# Patient Record
Sex: Female | Born: 1965 | Marital: Married | Smoking: Never smoker
Health system: Northeastern US, Community
[De-identification: ages and names within clinical notes are randomized; demographics above are authoritative.]

## PROBLEM LIST (undated history)

## (undated) DIAGNOSIS — E789 Disorder of lipoprotein metabolism, unspecified: Secondary | ICD-10-CM

## (undated) DIAGNOSIS — Z833 Family history of diabetes mellitus: Secondary | ICD-10-CM

## (undated) HISTORY — PX: EXCISION EXCESSIVE SKIN & SUBQ TISSUE ABDOMEN: PRO029

## (undated) HISTORY — DX: Family history of diabetes mellitus: Z83.3

---

## 2005-05-02 ENCOUNTER — Emergency Department: Payer: Self-pay | Admitting: Emergency Medicine

## 2005-05-12 ENCOUNTER — Emergency Department: Payer: Self-pay | Admitting: Emergency Medical Services

## 2012-11-01 ENCOUNTER — Encounter (HOSPITAL_BASED_OUTPATIENT_CLINIC_OR_DEPARTMENT_OTHER): Payer: Self-pay | Admitting: Emergency Medicine

## 2012-11-01 ENCOUNTER — Ambulatory Visit (HOSPITAL_BASED_OUTPATIENT_CLINIC_OR_DEPARTMENT_OTHER): Payer: PRIVATE HEALTH INSURANCE | Admitting: Emergency Medicine

## 2012-11-01 VITALS — BP 130/88 | HR 74 | Temp 98.6°F | Ht 60.43 in | Wt 176.0 lb

## 2012-11-01 DIAGNOSIS — Z0000 Encounter for general adult medical examination without abnormal findings: Principal | ICD-10-CM

## 2012-11-01 DIAGNOSIS — D509 Iron deficiency anemia, unspecified: Secondary | ICD-10-CM

## 2012-11-01 DIAGNOSIS — Z833 Family history of diabetes mellitus: Secondary | ICD-10-CM

## 2012-11-01 DIAGNOSIS — Z7189 Other specified counseling: Secondary | ICD-10-CM

## 2012-11-01 DIAGNOSIS — Z1239 Encounter for other screening for malignant neoplasm of breast: Secondary | ICD-10-CM

## 2012-11-01 DIAGNOSIS — R300 Dysuria: Secondary | ICD-10-CM

## 2012-11-01 HISTORY — DX: Family history of diabetes mellitus: Z83.3

## 2012-11-01 LAB — URINALYSIS
BILIRUBIN, URINE: NEGATIVE
GLUCOSE, URINE: NEGATIVE MG/DL
KETONE, URINE: NEGATIVE MG/DL
LEUKOCYTE ESTERASE: NEGATIVE
NITRITE, URINE: NEGATIVE
OCCULT BLOOD, URINE: NEGATIVE
PH URINE: 7.5 (ref 5.0–8.0)
PROTEIN, URINE: NEGATIVE MG/DL
SPECIFIC GRAVITY URINE: 1.01 (ref 1.003–1.035)

## 2012-11-01 LAB — URINE DIP (POINT OF CARE)
BILIRUBIN, URINE: NEGATIVE
GLUCOSE, URINE: NEGATIVE mg/dl
KETONE, URINE: NEGATIVE mg/dl
NITRITE, URINE: NEGATIVE
PH URINE: 7.5 (ref 5.0–8.0)
PROTEIN, URINE: NEGATIVE mg/dl (ref 0–15)
SPECIFIC GRAVITY URINE: 1.015 (ref 1.003–1.030)
UROBILINOGEN URINE: 0.2 mg/dl (ref 0.2–1.0)

## 2012-11-01 LAB — CBC, PLATELET & DIFFERENTIAL
ABSOLUTE BASO COUNT: 0 10*3/uL (ref 0.0–0.1)
ABSOLUTE EOSINOPHIL COUNT: 0.3 10*3/uL (ref 0.0–0.8)
ABSOLUTE IMM GRAN COUNT: 0.01 10*3/uL (ref 0.00–0.03)
ABSOLUTE LYMPH COUNT: 1.7 10*3/uL (ref 0.6–5.9)
ABSOLUTE MONO COUNT: 0.3 10*3/uL (ref 0.2–1.4)
ABSOLUTE NEUTROPHIL COUNT: 4.5 10*3/uL (ref 1.6–8.3)
BASOPHIL %: 0.6 % (ref 0.0–1.2)
EOSINOPHIL %: 4.3 % (ref 0.0–7.0)
HEMATOCRIT: 44.1 % (ref 34.1–44.9)
HEMOGLOBIN: 14.8 g/dL (ref 11.2–15.7)
IMMATURE GRANULOCYTE %: 0.1 % (ref 0.0–0.4)
LYMPHOCYTE %: 24.9 % (ref 15.0–54.0)
MEAN CORP HGB CONC: 33.6 g/dL (ref 31.0–37.0)
MEAN CORPUSCULAR HGB: 29.8 pg (ref 26.0–34.0)
MEAN CORPUSCULAR VOL: 88.9 fL (ref 80.0–100.0)
MEAN PLATELET VOLUME: 10.9 fL (ref 8.7–12.5)
MONOCYTE %: 3.9 % — ABNORMAL LOW (ref 4.0–13.0)
NEUTROPHIL %: 66.2 % (ref 40.0–75.0)
PLATELET COUNT: 282 10*3/uL (ref 150–400)
RBC DISTRIBUTION WIDTH STD DEV: 41.4 fL (ref 35.1–46.3)
RBC DISTRIBUTION WIDTH: 12.8 % (ref 11.5–14.3)
RED BLOOD CELL COUNT: 4.96 M/uL (ref 3.90–5.20)
WHITE BLOOD CELL COUNT: 6.7 10*3/uL (ref 4.0–11.0)

## 2012-11-01 NOTE — Patient Instructions (Addendum)
Please ask for visit records for PAP smear from [place] - ask if HPV test was done.  Please get copy of ultrasound results - computer disk - to bring.  Please bring any vaccination records (tetanus or others).    Please call [phone] to schedule your mammogram at [facility] (or other [place] location).
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
## 2012-11-02 DIAGNOSIS — D509 Iron deficiency anemia, unspecified: Secondary | ICD-10-CM | POA: Insufficient documentation

## 2012-11-02 LAB — URINE CULTURE: URINE CULTURE/COLONY COUNT: NO GROWTH

## 2012-11-02 LAB — HEMOGLOBIN A1C
ESTIMATED AVERAGE GLUCOSE: 111 (ref 74–160)
HEMOGLOBIN A1C: 5.5 % (ref 4.0–5.6)

## 2012-11-04 LAB — COMPREHENSIVE METABOLIC PANEL
ALANINE AMINOTRANSFERASE: 21 IU/L (ref 7–35)
ALBUMIN: 4.6 g/dl (ref 3.4–4.8)
ALKALINE PHOSPHATASE: 77 IU/L (ref 25–106)
ANION GAP: 11 mmol/L (ref 3–11)
ASPARTATE AMINOTRANSFERASE: 23 IU/L (ref 8–34)
BILIRUBIN TOTAL: 0.8 mg/dl (ref 0.2–1.1)
BUN (UREA NITROGEN): 15 mg/dl (ref 6–20)
CALCIUM: 9.6 mg/dl (ref 8.6–10.3)
CARBON DIOXIDE: 25 mmol/L (ref 22–32)
CHLORIDE: 102 mmol/L (ref 101–111)
CREATININE: 0.7 mg/dl (ref 0.4–1.2)
ESTIMATED GLOMERULAR FILT RATE: >60 mL/min (ref >60)
Glucose Random: 81 mg/dl (ref 74–160)
POTASSIUM: 4.2 mmol/L (ref 3.5–5.1)
SODIUM: 138 mmol/L (ref 135–144)
TOTAL PROTEIN: 7.6 g/dl — ABNORMAL HIGH (ref 5.9–7.5)

## 2012-11-04 LAB — VITAMIN D,25 HYDROXY: VITAMIN D,25 HYDROXY: 37 ng/ml (ref 30.0–100.0)

## 2012-11-04 LAB — HIV ANTIGEN ANTIBODY: HIV 1/2 PLUS O AG/AB SCREEN: NONREACTIVE

## 2012-11-04 LAB — FERRITIN: FERRITIN: 25 ng/ml (ref 11–307)

## 2012-11-04 LAB — HEPATITIS B SURFACE ANTIGEN: HEPATITIS B SURFACE ANTIGEN: NONREACTIVE

## 2012-11-04 LAB — HEPATITIS B SURFACE ANTIBODY: HEPATITIS B SURFACE ANTIBODY: NONREACTIVE

## 2012-11-04 LAB — LOW DENSITY LIPOPROTEIN DIRECT: LOW DENSITY LIPOPROTEIN DIRECT: 192 mg/dl (ref 0–100)

## 2012-11-04 LAB — HIGH DENSITY LIPOPROTEIN: HIGH DENSITY LIPOPROTEIN: 54 mg/dl (ref 35–85)

## 2012-11-04 LAB — CHOLESTEROL: Cholesterol: 278 mg/dl — ABNORMAL HIGH (ref 0–200)

## 2012-11-04 LAB — HEPATITIS C ANTIBODY: HEPATITIS C ANTIBODY: NEGATIVE

## 2012-11-08 ENCOUNTER — Telehealth (HOSPITAL_BASED_OUTPATIENT_CLINIC_OR_DEPARTMENT_OTHER): Payer: Self-pay | Admitting: Physician Assistant

## 2012-11-08 DIAGNOSIS — E785 Hyperlipidemia, unspecified: Principal | ICD-10-CM

## 2012-11-08 MED ORDER — ATORVASTATIN CALCIUM 40 MG PO TABS
40.0000 mg | ORAL_TABLET | Freq: Every day | ORAL | Status: DC
Start: 2012-11-08 — End: 2012-12-21

## 2012-11-08 NOTE — Progress Notes (Signed)
Called pt to discuss test results. Informed her that LDL was very high at 192, and that by new guidelines she should be treated with medication to lower her cholesterol. Discussed possible side effects of statin medications, she agreed to try treatment. Will send atorvastatin given new guidelines recommending intense statin with LDL > 190.

## 2012-11-10 ENCOUNTER — Telehealth (HOSPITAL_BASED_OUTPATIENT_CLINIC_OR_DEPARTMENT_OTHER): Payer: Self-pay | Admitting: Emergency Medicine

## 2012-11-10 NOTE — Progress Notes (Addendum)
.  Tammy Bradford, 11/10/2012, 8:21 AM  Prior authorization required for medication: (LIPITOR) 40 MG tablet    Membership ID (insurance): [id number]  if Medicare insurance , Prescription drug plan: Unknown

## 2012-11-15 ENCOUNTER — Telehealth (HOSPITAL_BASED_OUTPATIENT_CLINIC_OR_DEPARTMENT_OTHER): Payer: Self-pay | Admitting: Physician Assistant

## 2012-11-15 NOTE — Progress Notes (Signed)
Prior authorization request for atorvastastin  Clinical Indication: LDL >190, per new ACC/AHA guidelines this level of LDL should be treated with high intensity statin such as atorvastatin   Previous medications tried: none, see above  Diagnostic testing information: LDL 192

## 2012-11-15 NOTE — Progress Notes (Signed)
PRIOR AUTHORIZATION FOR Atorvastatin 40 mg  has been approved for 3 Years    Start Date: 11/15/2012  End Date: 11/16/2015    PA #: [id number]     The patient and their preferred pharmacy are aware of the approval.  The approval letter will be scanned into [employer] once received from insurance company.     Patient has [employer] for prescription coverage.  I.D.# [id number]

## 2012-11-23 ENCOUNTER — Ambulatory Visit: Payer: Self-pay | Admitting: Emergency Medicine

## 2012-11-25 LAB — MA SCREENING MAMMO BILATERAL WITH CAD

## 2012-12-14 ENCOUNTER — Ambulatory Visit (HOSPITAL_BASED_OUTPATIENT_CLINIC_OR_DEPARTMENT_OTHER): Payer: PRIVATE HEALTH INSURANCE

## 2012-12-14 DIAGNOSIS — E785 Hyperlipidemia, unspecified: Principal | ICD-10-CM

## 2012-12-14 LAB — HIGH DENSITY LIPOPROTEIN: HIGH DENSITY LIPOPROTEIN: 54 mg/dL (ref 40–60)

## 2012-12-14 LAB — LOW DENSITY LIPOPROTEIN DIRECT: LOW DENSITY LIPOPROTEIN DIRECT: 88 mg/dL (ref 0–189)

## 2012-12-14 LAB — CHOLESTEROL: Cholesterol: 154 mg/dL (ref 0–239)

## 2012-12-14 NOTE — Progress Notes (Signed)
Labs drawn.  1sst  sr.

## 2012-12-16 ENCOUNTER — Ambulatory Visit: Payer: Self-pay | Admitting: Emergency Medicine

## 2012-12-19 NOTE — Progress Notes (Signed)
Quick Note:    Please send a letter from me with test results and the following message:    Your cholesterol numbers are excellent! Please continue taking your medications and call the clinic if you have any questions.    ______

## 2012-12-21 ENCOUNTER — Ambulatory Visit (HOSPITAL_BASED_OUTPATIENT_CLINIC_OR_DEPARTMENT_OTHER): Payer: PRIVATE HEALTH INSURANCE | Admitting: Emergency Medicine

## 2012-12-21 ENCOUNTER — Encounter (HOSPITAL_BASED_OUTPATIENT_CLINIC_OR_DEPARTMENT_OTHER): Payer: Self-pay | Admitting: Emergency Medicine

## 2012-12-21 VITALS — BP 139/91 | HR 69 | Temp 99.1°F | Wt 175.0 lb

## 2012-12-21 DIAGNOSIS — E785 Hyperlipidemia, unspecified: Secondary | ICD-10-CM

## 2012-12-21 DIAGNOSIS — R1013 Epigastric pain: Principal | ICD-10-CM

## 2012-12-21 DIAGNOSIS — Z23 Encounter for immunization: Secondary | ICD-10-CM

## 2012-12-21 DIAGNOSIS — A048 Other specified bacterial intestinal infections: Secondary | ICD-10-CM | POA: Insufficient documentation

## 2012-12-21 LAB — US BREAST BILATERAL

## 2012-12-21 MED ORDER — ATORVASTATIN CALCIUM 40 MG PO TABS
40.0000 mg | ORAL_TABLET | Freq: Every day | ORAL | Status: DC
Start: 2012-12-21 — End: 2013-10-14

## 2012-12-21 NOTE — Progress Notes (Signed)
Quick Note:    Reviewed at office visit 12/21/2012  Given copy of results.  ______

## 2012-12-21 NOTE — Patient Instructions (Addendum)
Call the pharmacy for refills of cholesterol medicine.    Fasting blood tests - return in morning - nothing to eat or drink but water for 10-12 hours.  lab is open at 9am Mon-Friday when clinic is open.  No appointment is necessary.  In 6 months - October 2014.

## 2012-12-21 NOTE — Progress Notes (Signed)
Tammy Bradford is a 47 year old female  Cholesterol levels.    Started atorvastatin - March 2014 - taking as directed.  Has cut beef down to once a week. Limiting shrimp and lobster - increasing fruits and vegetables. 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  Given results - asks if she can stop medication - discussed that to keep the LDL low - she needs to continue medication and diet/exercise.  She wants to know if dose could be cut down.  Encouraged to maintain lifestyle changes - and will retest in 6 months  - at that time  - we can try cutting back on the dose.  Future order done for fasting lipids.    LDL DIRECT (mg/dL)   Date  Value    09/12/1094  88     11/01/2012  192*   ----------    Had episode of epigastric pain - which she thought might be liver or return of h pylori.  Started omeprazole with good effect.  Treated one year ago for h pylori - discussed that blood test may still be positive - so would need to test stool - order done.            Physical Exam   Nursing note and vitals reviewed.  Constitutional: She appears well-developed and well-nourished. She appears distressed.   Neurological: She is alert.   Skin: Skin is warm.   Psychiatric: She has a normal mood and affect. Her behavior is normal.     ASSESSMENT/PLAN:  (789.06) Abdominal pain, epigastric  (primary encounter diagnosis)  Comment: will retest for h pylori - encouraged to cut back on omeprazole - as may be difficult to taper the longer she is on it.  Plan: HELICOBACTER PYLORI STOOL AG (INHOUSE)          (272.4) Hyperlipidemia  Comment: refill done.  Plan: atorvastatin (LIPITOR) 40 MG tablet, LIPID         PANEL, COLLECTION VENOUS BLOOD VENIPUNCTURE          (V05.3) Need for prophylactic vaccination and inoculation against viral hepatitis  Plan: HEPATITIS B VACCINE ADULT 3 DOSE IM, HEPATITIS         B VACCINE ADULT 3 DOSE IM, IMMUNIZATION ADMIN         SINGLE, RN          (V06.1) Need for prophylactic vaccination with combined  diphtheria-tetanus-pertussis (DTP) vaccine  Plan: TDAP VACCINE 7 YRS/> IM, IMMUNIZATION ADMIN         EACH ADD, RN                  12/21/2012  VIS given prior to administration and reviewed with the patient and or legal guardian. Patient understands the disease and the vaccine. See immunization/Injection module or chart review for date of publication and additional information.  Helayne Seminole, MD  12/21/2012  VIS given prior to administration and reviewed with the patient and or legal guardian. Patient understands the disease and the vaccine. See immunization/Injection module or chart review for date of publication and additional information.  Helayne Seminole, MD

## 2012-12-21 NOTE — Progress Notes (Signed)
12/21/2012  VIS given prior to administration and reviewed with the patient and or legal guardian. Patient understands the disease and the vaccine. See immunization/Injection module or chart review for date of publication and additional information.  Arcelia Jew, LPN    Per orders of Dr. Loralee Pacas, injection of Hep-B, Tdap given by Arcelia Jew, LPN. Patient instructed to remain in clinic for 20 minutes afterwards, and to report any adverse reaction to me immediately.

## 2012-12-25 LAB — HELICOBACTER PYLORI STOOL AG

## 2012-12-31 NOTE — Progress Notes (Signed)
Quick Note:    Dear Tammy Bradford    Your recent stool test did not show h pylori infection.    Please call the clinic if you have any questions.    Sincerely,      Helayne Seminole, MD    ______

## 2013-01-11 ENCOUNTER — Telehealth (HOSPITAL_BASED_OUTPATIENT_CLINIC_OR_DEPARTMENT_OTHER): Payer: Self-pay | Admitting: Physician Assistant

## 2013-01-11 ENCOUNTER — Ambulatory Visit: Payer: Self-pay | Admitting: Emergency Medicine

## 2013-01-11 LAB — MA DIAGNOSTIC MAMMO BILATERAL WITH CAD

## 2013-01-11 LAB — US BREAST-AXILLA LEFT

## 2013-01-11 NOTE — Progress Notes (Signed)
Called pt to discuss recent breast ultrasound results. No answer so left general message requesting return call if desired, will send letter with results. Pt had BI-RADS 0 mammogram, and additional ultrasound imaging showed benign finding of cysts, recommend one year repeat mammogram

## 2013-01-11 NOTE — Progress Notes (Signed)
Quick Note:    Please send a letter from me with the following message:    Additional ultrasound imaging has shown cysts in both of your breasts, which are a benign finding. You should repeat a mammogram in 1 year. Please call the clinic if you have any questions.    ______

## 2013-01-13 ENCOUNTER — Telehealth (HOSPITAL_BASED_OUTPATIENT_CLINIC_OR_DEPARTMENT_OTHER): Payer: Self-pay | Admitting: Internal Medicine

## 2013-01-13 NOTE — Progress Notes (Addendum)
Paged by patient for abd pain    Started with morning with HA and abdominal pain and heartburn  No vomiting  Feet and hands are getting cold  No fevers  No diarrhea  Started after having coffee this AM  Taking mint tea  Had normal BM yesterday  Has not tried tums  Takes omeprazole    Had pain like this a few weeks ago - but not as severe and saw dr. Loralee Pacas and told dr. Loralee Pacas she was worried about h.pylori infxn which she has had in the past  Dr. Loralee Pacas did a stool study that was negative for h.pylori    Throughout the day today, pain is not getting worse or better today but staying the same     Meds: taking atorvastatin 40 mg and aspirin 81 mg and omperazole 20 mg    Discussed with her this could be gastritis, esophagitis, ulcer, pancreatitis or obstruction    If she cant' drink fluids -->to ED  If pain worsens or does not improve -->to ED  Otherwise, can try TUMS and rest - and I can see her at 2:20 tomorrow for more eval    Can call me if she has any questions- will be on page.

## 2013-01-14 ENCOUNTER — Encounter (HOSPITAL_BASED_OUTPATIENT_CLINIC_OR_DEPARTMENT_OTHER): Payer: Self-pay | Admitting: Internal Medicine

## 2013-01-14 ENCOUNTER — Ambulatory Visit (HOSPITAL_BASED_OUTPATIENT_CLINIC_OR_DEPARTMENT_OTHER): Payer: PRIVATE HEALTH INSURANCE | Admitting: Internal Medicine

## 2013-01-14 VITALS — BP 134/76 | HR 84 | Temp 98.7°F | Wt 172.9 lb

## 2013-01-14 DIAGNOSIS — R51 Headache: Secondary | ICD-10-CM

## 2013-01-14 DIAGNOSIS — R519 Headache, unspecified: Secondary | ICD-10-CM

## 2013-01-14 DIAGNOSIS — K2970 Gastritis, unspecified, without bleeding: Principal | ICD-10-CM

## 2013-01-14 DIAGNOSIS — R350 Frequency of micturition: Secondary | ICD-10-CM

## 2013-01-14 LAB — URINE DIP (POINT OF CARE)
BILIRUBIN, URINE: NEGATIVE
GLUCOSE, URINE: NEGATIVE mg/dl
KETONE, URINE: NEGATIVE mg/dl
NITRITE, URINE: NEGATIVE
OCCULT BLOOD, URINE: NEGATIVE
PH URINE: 6.5 (ref 5.0–8.0)
PROTEIN, URINE: NEGATIVE mg/dl (ref 0–15)
SPECIFIC GRAVITY URINE: 1.02 (ref 1.003–1.030)
UROBILINOGEN URINE: 0.2 mg/dl (ref 0.2–1.0)

## 2013-01-14 LAB — CBC WITH PLATELET
HEMATOCRIT: 42.2 % (ref 34.1–44.9)
HEMOGLOBIN: 14.4 g/dL (ref 11.2–15.7)
MEAN CORP HGB CONC: 34.1 g/dL (ref 31.0–37.0)
MEAN CORPUSCULAR HGB: 30.1 pg (ref 26.0–34.0)
MEAN CORPUSCULAR VOL: 88.1 fL (ref 80.0–100.0)
MEAN PLATELET VOLUME: 10.8 fL (ref 8.7–12.5)
PLATELET COUNT: 254 10*3/uL (ref 150–400)
RBC DISTRIBUTION WIDTH STD DEV: 39.5 fL (ref 35.1–46.3)
RBC DISTRIBUTION WIDTH: 12.3 % (ref 11.5–14.3)
RED BLOOD CELL COUNT: 4.79 M/uL (ref 3.90–5.20)
WHITE BLOOD CELL COUNT: 7.3 10*3/uL (ref 4.0–11.0)

## 2013-01-14 LAB — FERRITIN: FERRITIN: 30 ng/mL (ref 8–252)

## 2013-01-14 MED ORDER — OMEPRAZOLE 20 MG PO CPDR
20.0000 mg | DELAYED_RELEASE_CAPSULE | Freq: Every day | ORAL | Status: DC
Start: 2013-01-14 — End: 2013-03-18

## 2013-01-14 MED ORDER — RANITIDINE HCL 150 MG PO CAPS
150.00 mg | ORAL_CAPSULE | Freq: Two times a day (BID) | ORAL | Status: AC
Start: 2013-01-14 — End: 2013-01-21

## 2013-01-14 NOTE — Progress Notes (Signed)
Tammy Bradford is a 47 year old female here for abd pain   I spoke to her on the phone yesterday  After having coffee yesterday AM developed severe abd pain epigastric with reflux symptoms  Severe all day and her abd hurt and her extremities were cold  When I spoke to her i recommended she take TUMS which she says helped a lot  She is on omeprazole 20 mg but just started  She was recently retested for h.pylori by stool and this was negative.    She DOES has hx of gastritis for which she was found to have h.pylori in the PAST and treated  She takes asa 81 mg a day as she was once in the ED at [facility] with chest pain - she states she was told she did not have a heart problem but that it "could not hurt" to take an aspirin daily  Does not take nsaids  No melena  No diarrhea, no constipation  No vomiting  No hx of EGD  Non smoker  No family hx of GI malignancy    Other concerns  Woke up with some pain at the back of her head today  No neck stiffness  No fever    Notes that for the past few weeks she has been urinating more frequently and it hurts a little      BP 134/76  Pulse 84  Temp(Src) 98.7 F (37.1 C) (Temporal)  Wt 172 lb 14.4 oz (78.427 kg)  BMI 33.29 kg/m2  SpO2 98%  LMP 11/01/2012  Well appearing  Abd: normoactive BS  Soft,   Mild tenderness in epigastrium without rebound  No suprapubic pain  MSK: tender along occiput to palpation    Urine dip = +leuk, no blood, no nitrates    A/P    Gastritis - likely not ulcer given resolved with TUMS  Will check CBC and ferritin today to make sure there is no blood loss  Will given raniditine 150 mg bid for one week as it works more quickly than omeprazole and have her take omeprazole 20 mg daily at the same time  Stop aspirin    She has never had an EGD and might need one -will confer with dr. Loralee Pacas    Urinary frequency - with +leuk on dip -send for dx    Occipital HA- may be positional re: how she sleeps. Seems more MSK on exam  Will follow- if worsens,  asked her to let us know    We discussed the patients current medications. The patient expressed understanding and no barriers to adherence were identified.Medications were reconciled during this visit and a current medication list was given to the patient at the end of the visit. I have reviewed the past medical, surgical, social and family history and updated these sections of EpicCare as relevant. All interim labs, test results, and consult notes were reviewed and discussed with Tammy Bradford.

## 2013-01-14 NOTE — Patient Instructions (Signed)
STOP ASPIRIN  TAKE ZANTAC (RANITIDINE) TWICE A DAY FOR ONE WEEK  IT IS OK ALSO TO USE TUMS  AT THE SAME TIME, START OMEPRAZOLE 20 MG DAILY

## 2013-01-15 LAB — URINALYSIS
BACTERIA: >50 PER HPF — AB (ref 0–5)
BILIRUBIN, URINE: NEGATIVE
CASTS: NONE SEEN PER LPF
CRYSTALS: NONE SEEN
GLUCOSE, URINE: NEGATIVE MG/DL
KETONE, URINE: NEGATIVE MG/DL
NITRITE, URINE: NEGATIVE
OCCULT BLOOD, URINE: NEGATIVE
PH URINE: 6 (ref 5.0–8.0)
PROTEIN, URINE: NEGATIVE MG/DL
SPECIFIC GRAVITY URINE: 1.02 (ref 1.003–1.035)
SQUAMOUS EPITHELIAL CELLS: >10 PER LPF — AB (ref 0–4)

## 2013-01-15 LAB — URINE CULTURE: URINE CULTURE/COLONY COUNT: NO GROWTH

## 2013-01-16 NOTE — Progress Notes (Signed)
Quick Note:    Tammy Bradford  This patient saw me Saturday for bad gastritis  Labs are reassuring in that there is no evidence of GI bleed  She was also having some urinary complaints but the UA is normal   She has had recurrent gastritis - do you want me to set up an endoscopy or recommend a GI appt- or are you interested in speaking to her first  ______

## 2013-01-18 ENCOUNTER — Telehealth (HOSPITAL_BASED_OUTPATIENT_CLINIC_OR_DEPARTMENT_OTHER): Payer: Self-pay | Admitting: Emergency Medicine

## 2013-01-18 DIAGNOSIS — R1013 Epigastric pain: Principal | ICD-10-CM

## 2013-01-18 NOTE — Progress Notes (Signed)
Patient restarted on PPI for epigastric pain.  H pylori stool test negative.  I would like to refer to GI for EGD.  Will ask Larita Fife to call her to discuss -   If she is agreeable - I will place referral to GI.

## 2013-01-19 NOTE — Progress Notes (Signed)
Tried to call patient, no answer, left message on voicemail to call clinic back.

## 2013-01-20 NOTE — Progress Notes (Signed)
Called patient, explained message below, she agrees to see GI, advised her provider will enter referral and she will be contacted when appt is scheduled.

## 2013-01-25 ENCOUNTER — Ambulatory Visit (HOSPITAL_BASED_OUTPATIENT_CLINIC_OR_DEPARTMENT_OTHER): Payer: PRIVATE HEALTH INSURANCE

## 2013-01-25 DIAGNOSIS — Z23 Encounter for immunization: Principal | ICD-10-CM

## 2013-01-25 NOTE — Progress Notes (Signed)
Quick Note:    Dear Tammy Bradford    Thank you for getting your mammogram done!    Your mammogram is normal, and will need to be repeated in one year.    Sincerely,      Helayne Seminole, MD    ______

## 2013-01-25 NOTE — Progress Notes (Signed)
VIS given prior to administration and reviewed with the patient and or legal guardian. Patient understands the disease and the vaccine. See immunization/Injection module or chart review for date of publication and additional information.    #2 hep B vaccine given, patient understands third in series will be due 06/22/13.

## 2013-03-18 ENCOUNTER — Other Ambulatory Visit (HOSPITAL_BASED_OUTPATIENT_CLINIC_OR_DEPARTMENT_OTHER): Payer: Self-pay | Admitting: Emergency Medicine

## 2013-03-18 DIAGNOSIS — K2970 Gastritis, unspecified, without bleeding: Principal | ICD-10-CM

## 2013-03-18 MED ORDER — OMEPRAZOLE 20 MG PO CPDR
20.0000 mg | DELAYED_RELEASE_CAPSULE | Freq: Every day | ORAL | Status: AC
Start: 2013-03-18 — End: 2013-05-18

## 2013-03-18 NOTE — Progress Notes (Signed)
Person calling on behalf of patient: Pharmacy    Tammy Bradford is a 47 year old female       - medication(s) request: omeprazole 20 mg  - last office visit: 01/14/2013  - last physical exam: 11/01/2012      Other Med Adult:  Most Recent BP Reading(s)  01/14/13 : 134/76      Cholesterol (mg/dL)   Date  Value    09/12/1094  154    ----------  LDL DIRECT (mg/dL)   Date  Value    0/12/5407  88    ----------  HDL (mg/dL)   Date  Value    04/07/1913  54    ----------  No results found for this basename: tg      No results found for this basename: TSHSC      No results found for this basename: TSH    HEMOGLOBIN A1C (%)   Date  Value    11/01/2012  5.5    ----------      No results found for this basename: INR           Documented patient preferred pharmacies:    [id number] - [place], [place] - [address], [address] AT [facility]  Phone: [phone] Fax: [phone]

## 2013-03-18 NOTE — Telephone Encounter (Signed)
Message copied by Namon Cirri on Sat Mar 18, 2013  2:09 PM  ------       Message from: Kennyth Lose       Created: Sat Mar 18, 2013 12:25 PM       Regarding: refill         [facility]              Person calling on behalf of patient: Pharmacy              May list multiple medications in this section              Medicine Name: Omeprazole 20 mg 90 day supply                            Documented patient preferred pharmacies:        [id number] - [place], [place] - [address] AT [facility]       Phone: [phone] Fax: [phone]                       ------

## 2013-03-23 ENCOUNTER — Telehealth (HOSPITAL_BASED_OUTPATIENT_CLINIC_OR_DEPARTMENT_OTHER): Payer: Self-pay

## 2013-03-23 DIAGNOSIS — R1013 Epigastric pain: Principal | ICD-10-CM

## 2013-03-23 NOTE — Telephone Encounter (Signed)
Message copied by Emelia Loron on Thu Mar 23, 2013  9:52 AM  ------       Message from: Janalee Dane       Created: Thu Mar 23, 2013  9:25 AM       Regarding: requesting referral for GI                       Tammy Bradford [id number], 47 year old, female, Telephone Information:       Home Phone      [phone]       Work Phone      Not on file.       Mobile          Not on file.                     Patient's Preferred Pharmacy:               [id number] - [place], [place] - [address], [address] AT [facility]       Phone: [phone] Fax: [phone]                     CONFIRMED TODAY: Yes              CALL BACK NUMBER: [phone]              Patient's language of care: English              Patient does not need an interpreter.              Patient's PCP: Helayne Seminole, MD              Person calling on behalf of patient: Patient (self)              Calls today requesting a referral. What is the reason you need to see this specialist (diagnosis)? Patient was seen by Dr Renard Hamper in May for gastritis who left a note for Dr Loralee Pacas in her chart. There was a call put out but no referral was ever put in. She is requesting a referral because she is still has symptoms and wants to see the specialist. She can be reached at the above number if need be.b Thank you                ------

## 2013-03-23 NOTE — Progress Notes (Signed)
Patient awaiting GI referral, forwarded to pcp.

## 2013-03-23 NOTE — Progress Notes (Signed)
Referral to GI done

## 2013-03-28 ENCOUNTER — Ambulatory Visit (HOSPITAL_BASED_OUTPATIENT_CLINIC_OR_DEPARTMENT_OTHER): Payer: PRIVATE HEALTH INSURANCE | Admitting: Emergency Medicine

## 2013-03-28 ENCOUNTER — Encounter (HOSPITAL_BASED_OUTPATIENT_CLINIC_OR_DEPARTMENT_OTHER): Payer: Self-pay | Admitting: Emergency Medicine

## 2013-03-28 VITALS — BP 128/90 | HR 68 | Temp 97.6°F | Wt 172.7 lb

## 2013-03-28 DIAGNOSIS — E785 Hyperlipidemia, unspecified: Secondary | ICD-10-CM

## 2013-03-28 DIAGNOSIS — IMO0001 Reserved for inherently not codable concepts without codable children: Secondary | ICD-10-CM

## 2013-03-28 DIAGNOSIS — R350 Frequency of micturition: Secondary | ICD-10-CM

## 2013-03-28 DIAGNOSIS — R1013 Epigastric pain: Principal | ICD-10-CM

## 2013-03-28 LAB — COMPREHENSIVE METABOLIC PANEL
ALANINE AMINOTRANSFERASE: 38 U/L (ref 12–45)
ALBUMIN: 4.3 g/dL (ref 3.4–5.0)
ALKALINE PHOSPHATASE: 106 U/L (ref 45–117)
ANION GAP: 7 mmol/L (ref 5–15)
ASPARTATE AMINOTRANSFERASE: 22 U/L (ref 8–34)
BILIRUBIN TOTAL: 0.4 mg/dL (ref 0.2–1.0)
BUN (UREA NITROGEN): 12 mg/dL (ref 7–18)
CALCIUM: 9.9 mg/dL (ref 8.5–10.1)
CARBON DIOXIDE: 30 mmol/L (ref 21–32)
CHLORIDE: 103 mmol/L (ref 98–107)
CREATININE: 0.6 mg/dL (ref 0.4–1.2)
ESTIMATED GLOMERULAR FILT RATE: >60 mL/min (ref >60)
Glucose Random: 90 mg/dL (ref 74–160)
POTASSIUM: 4.2 mmol/L (ref 3.5–5.1)
SODIUM: 140 mmol/L (ref 136–145)
TOTAL PROTEIN: 7.5 g/dL (ref 6.4–8.2)

## 2013-03-28 LAB — URINE DIP (POINT OF CARE)
BILIRUBIN, URINE: NEGATIVE
GLUCOSE, URINE: NEGATIVE mg/dl
KETONE, URINE: NEGATIVE mg/dl
LEUKOCYTE ESTERASE: NEGATIVE
NITRITE, URINE: NEGATIVE
OCCULT BLOOD, URINE: NEGATIVE
PH URINE: 7 (ref 5.0–8.0)
PROTEIN, URINE: NEGATIVE mg/dl (ref 0–15)
SPECIFIC GRAVITY URINE: 1.01 (ref 1.003–1.030)
UROBILINOGEN URINE: 0.2 mg/dl (ref 0.2–1.0)

## 2013-03-28 LAB — CREATINE KINASE TOTAL: CREATINE KINASE TOTAL: 55 IU/L (ref 26–192)

## 2013-03-28 NOTE — Progress Notes (Signed)
Tammy Bradford is a 47 year old female  Epigastric pain -     Seen May 2014 - epigastric pain -started on omeprazole - working - helpful.  Missed dose on Wednesday morning - had severe pain - ongoing -   Has to eat every 2 hours - to reduce the pain.  Would like to have evaluation to see what is wrong - explained open access process.  h pylori stool test negative in April 2014.    Myalgias - in arms - she thinks it is cholesterol medicine - atorvastatin - started March  2014 - for very high LDL.  Started one month ago - Upper back and shoulders.  She was afraid to stop without speaking to me - so cut pill in half.    LDL DIRECT (mg/dL)   Date  Value    01/09/980  88     On atorvastatin    11/01/2012  192*   ----------      Urinary frequency - no burning.  No fever chills - nausea or vomiting.        Physical Exam   Nursing note and vitals reviewed.  Constitutional: She appears well-developed and well-nourished.   Abdominal: Soft. There is tenderness (suprapubic tenderness).   No CVAT   Neurological: She is alert.   Skin: Skin is warm and dry.   Psychiatric: She has a normal mood and affect. Her behavior is normal.     U/a dip is negative.    ASSESSMENT/PLAN:  (789.06) Abdominal pain, epigastric  (primary encounter diagnosis)  Comment: continue omeprazole.  Plan: REFERRAL TO OPEN ACCESS UPPER ENDOSCOPY (EGD)          (729.1) Myalgia and myositis, unspecified  (272.4) Hyperlipidemia  Comment: advised to stop atorvastatin - will check labs -   Plan: CREATINE KINASE (CK), COMPREHENSIVE METABOLIC         PANEL, COLLECTION VENOUS BLOOD VENIPUNCTURE          (788.41) Urinary frequency  Comment: neg urine dip

## 2013-03-31 ENCOUNTER — Telehealth (HOSPITAL_BASED_OUTPATIENT_CLINIC_OR_DEPARTMENT_OTHER): Payer: Self-pay

## 2013-04-26 ENCOUNTER — Ambulatory Visit (HOSPITAL_BASED_OUTPATIENT_CLINIC_OR_DEPARTMENT_OTHER): Payer: PRIVATE HEALTH INSURANCE | Admitting: Gastroenterology

## 2013-05-09 ENCOUNTER — Encounter (HOSPITAL_BASED_OUTPATIENT_CLINIC_OR_DEPARTMENT_OTHER): Payer: Self-pay

## 2013-05-09 ENCOUNTER — Emergency Department (HOSPITAL_BASED_OUTPATIENT_CLINIC_OR_DEPARTMENT_OTHER)
Admission: RE | Admit: 2013-05-09 | Disposition: A | Discharge status: Home / Self Care | Payer: Self-pay | Source: Emergency Department | Attending: Physician Assistant | Admitting: Physician Assistant

## 2013-05-09 MED ORDER — IBUPROFEN 800 MG PO TABS
800.00 mg | ORAL_TABLET | Freq: Once | ORAL | Status: AC
Start: 2013-05-09 — End: 2013-05-09
  Administered 2013-05-09: 800 mg via ORAL
  Filled 2013-05-09: qty 1

## 2013-05-09 MED ORDER — IBUPROFEN 800 MG PO TABS
800.0000 mg | ORAL_TABLET | Freq: Three times a day (TID) | ORAL | Status: AC | PRN
Start: 2013-05-09 — End: 2013-06-08

## 2013-05-09 NOTE — Discharge Instructions (Signed)
DIAGNOSIS & TREATMENT:  You were seen in a [facility] Emergency Department for foot pain.     FURTHER CARE:  Use ortho shoe for ambulation. Ice foot periodically during the day. Performing stretching exercises.     NEW MEDICATIONS:  Ibuprofen: This is an NSAID anti-inflammatory. Please use this as needed for pain or fever. Please take this with food to prevent stomach irritation. For an adult, do not take more than 3200mg  of this medication in any 24 hour period. Do not take any other NSAIDs (like Advil, Aleve, Naproxen, diclofenac) while taking this medication. Please contact your doctor or pharmacist for any additional questions.    WHEN SHOULD YOU BE SEEN NEXT?   Please call your doctor and be seen with in the next 7 days for re-evaluation if your symptoms are not improving. If you do not have a primary care doctor or would like to transfer your primary care to [facility], please call [phone] to set one up an appointment.    WHEN SHOULD YOU RETURN TO THE ED?  Please return to the emergency room if you develop your symptoms worsen, you get new symptoms or you are unable to schedule follow up care.

## 2013-05-09 NOTE — ED Provider Notes (Signed)
eMERGENCY dEPARTMENT Physician Assistant NOTE    The ED nursing record was reviewed.   The prior medical records as available electronically through Epic were reviewed.  The mode of arrival was Relative on 05/09/2013  2:58 PM.    CHIEF COMPLAINT    Patient presents with:    Foot Pain - R FOOT PAIN-ID SHOWN      HPI    Tammy Bradford is a 47 year old female who presents with right foot pain.  Onset this morning after waking up and walking to the bathroom.  Reports pain near the ball of her foot. Works with weight bearing and ambulation. Better walking on lateral foot. Was pushing a safe with her foot yesterday and is concerned she broke it. No medication for the pain. Denies trauma, fever, chills, n/v, numbness, tingling, weakness, hx gout.      PAST MEDICAL HISTORY      Past Medical History    FH: diabetes mellitus 11/01/2012    Comment: Mother is diabetic       PROBLEM LIST  Patient Active Problem List:     FH: diabetes mellitus     Iron deficiency anemia     H. pylori infection      SURGICAL HISTORY        Past Surgical History    ------------OTHER-------------  1999    Comment plastic surgery - abdominoplasty    ------------OTHER-------------  2005    Comment pelvic procedure in [place]       CURRENT MEDICATIONS    1. Biotin 1000 MCG TABS Tablet  Route: Oral ZOX:WRUE 1,000 mcg by mouth daily.  Dispense:  Refill:     2. omeprazole (PRILOSEC) 20 MG capsule  Route: Oral AVW:UJWJ 1 capsule by mouth daily.  Dispense: 90 capsule Refill: 3    3. EXPIRED: atorvastatin (LIPITOR) 40 MG tablet  Route: Oral XBJ:YNWG 1 tablet by mouth daily.  Dispense: 30 tablet Refill: 11      ALLERGIES    Review of Patient's Allergies indicates:   Chlorpromazine          Anaphylaxis    FAMILY HISTORY      Family History    Diabetes Mother     Cancer - Breast FamHxNeg     Cancer - Cervical FamHxNeg     Cancer - Colon FamHxNeg     Heart Mother     Comment: s/p bypass surgery    Hypertension Mother        SOCIAL HISTORY    Social History     Marital Status: Married             Spouse Name:                       Years of Education:                 Number of children:               Social History Main Topics    Smoking Status: Never Smoker                      Smokeless Status: Never Used                        Alcohol Use: No              Drug Use: No  Sexual Activity: Yes               Partners with: Female       Birth Control/Protection: Vasectomy    Other Topics            Concern  Blood Transfusions      No  Seat Belt               Yes    Social History Narrative    Born in [place] - living in [place] x 9 yrs.    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    2 children - ages 41 and 72; has step grandchildren.        REVIEW OF SYSTEMS     The pertinent positives are reviewed in the HPI above. All other systems were reviewed and are negative.    PHYSICAL EXAM      Vital Signs:  BP 118/85   Pulse 82   Temp(Src) 98.7 F   Resp 16   Wt 78 kg (171 lb 15.3 oz)   BMI 33.1 kg/m2   SpO2 100%   LMP 11/01/2012     Constitutional: Well-developed, Well-nourished, Non-toxic appearance. Speaking full sentences.    Distress: none    MUSCULOSKELETAL : Moving all 4 extremities. Ambulatory w/ a steady gait. The spine straight and nontender.     Right  Knee: No edema, ecchymosis, erythema, calor; No joint laxity; No valgus or varus strain; No McMurray; No anterior drawer test, No patellar tenderness, no fibular head tenderness, Full extension and flexion.   Able to weight bear > 4 steps; +CSM distally    Right  Ankle/foot: intermittently tender on soft tissue near ball of right foot. No bony tenderness. FAROM; Ankle SAR 5/5, No ecchymosis, edema, erythema, calor; No joint laxity; No lateral or medial malleolar tenderness, no midfoot or 5th metatarsal tenderness,  able to weight bear > 4 steps; Negative thompsom test;  +CSM distally    SKIN: Warm and dry, no rash . The skin color and turgor are normal.     NEUROLOGIC: Normal mental status. Cranial nerves, motor,  sensor, DTRs, and cerebellum are grossly intact.     PSYCHIATRIC: Normal affect.          RESULTS  No results found for this visit on 05/09/13 (from the past 24 hour(s)).       MEDICATIONS ADMINISTERED ON THIS VISIT    Medication Orders Placed This Encounter  Biotin 1000 MCG TABS Tablet   Sig: Take 1,000 mcg by mouth daily.    ED COURSE & MEDICAL DECISION MAKING      I reviewed the patient's past medical history/problem list, past surgical history, medication list, social history and allergies.    Arrival: Pt arrived in stable condition and required no immediate interventions.    ED Decision Making & Course: Pt is a 47 year old female who presents with atraumatic onset of right foot pain.  Afebrile nontoxic.  Vital signs are stable. No evidence of neurovascular compromise.  No reports of trauma no evidence of trauma exam.  No signs of bony tenderness.  No concern for fracture dislocation.  No imaging is warranted.  No evidence of infection or gout  Patient's exam was inconsistent but seems to be treated with weight bearing.  Could be some plantar fasciitis although she is only intermittently tender when those were flexed.  No clear etiology.  We'll instruct her to do this plantar fasciitis stretches and use an Ortho  shoe.  Motrin for pain. Pt remained hemodynamically stable during their stay in the emergency department.     Follow Up:     The patient and/or family were educated on signs and symptoms for which they should return to the emergency department. Pt was discharged home. Pt and/or family understood and agreed with plan.    CONDITION AT DISCHARGE   Stable and Improved    DISCHARGE DIAGNOSIS  Foot pain      NEW PRESCRIPTIONS FROM THIS VISIT  New Prescriptions    IBUPROFEN (ADVIL,MOTRIN) 800 MG TABLET    Take 1 tablet by mouth every 8 (eight) hours as needed. pain            Electronically signed by:   Vickey Sages, PAC, 05/09/2013 3:36 PM  Dept.of Emergency Medicine  [facility]

## 2013-05-10 ENCOUNTER — Telehealth (HOSPITAL_BASED_OUTPATIENT_CLINIC_OR_DEPARTMENT_OTHER): Payer: Self-pay

## 2013-06-27 ENCOUNTER — Ambulatory Visit (HOSPITAL_BASED_OUTPATIENT_CLINIC_OR_DEPARTMENT_OTHER): Payer: PRIVATE HEALTH INSURANCE | Admitting: Physician Assistant

## 2013-06-27 ENCOUNTER — Encounter (HOSPITAL_BASED_OUTPATIENT_CLINIC_OR_DEPARTMENT_OTHER): Payer: Self-pay | Admitting: Physician Assistant

## 2013-06-27 VITALS — BP 110/83 | HR 67 | Temp 97.5°F | Wt 177.0 lb

## 2013-06-27 DIAGNOSIS — E785 Hyperlipidemia, unspecified: Secondary | ICD-10-CM

## 2013-06-27 DIAGNOSIS — Z23 Encounter for immunization: Secondary | ICD-10-CM

## 2013-06-27 DIAGNOSIS — N390 Urinary tract infection, site not specified: Principal | ICD-10-CM

## 2013-06-27 LAB — URINALYSIS
BACTERIA: >50 PER HPF — AB (ref 0–5)
BILIRUBIN, URINE: NEGATIVE
CASTS: NONE SEEN PER LPF
CRYSTALS: NONE SEEN
GLUCOSE, URINE: NEGATIVE MG/DL
KETONE, URINE: NEGATIVE MG/DL
NITRITE, URINE: NEGATIVE
OCCULT BLOOD, URINE: NEGATIVE
PH URINE: 7.5 (ref 5.0–8.0)
PROTEIN, URINE: NEGATIVE MG/DL
SPECIFIC GRAVITY URINE: 1.015 (ref 1.003–1.035)
SQUAMOUS EPITHELIAL CELLS: >10 PER LPF — AB (ref 0–4)

## 2013-06-27 LAB — URINE DIP (POINT OF CARE)
BILIRUBIN, URINE: NEGATIVE
GLUCOSE, URINE: NEGATIVE mg/dl
KETONE, URINE: NEGATIVE mg/dl
NITRITE, URINE: NEGATIVE
OCCULT BLOOD, URINE: NEGATIVE
PH URINE: 7.5 (ref 5.0–8.0)
PROTEIN, URINE: NEGATIVE mg/dl (ref 0–15)
SPECIFIC GRAVITY URINE: 1.015 (ref 1.003–1.030)
UROBILINOGEN URINE: 0.2 mg/dl (ref 0.2–1.0)

## 2013-06-27 LAB — LIPID PANEL
Cholesterol: 245 mg/dL (ref 0–239)
HIGH DENSITY LIPOPROTEIN: 62 mg/dL (ref 40–?)
LOW DENSITY LIPOPROTEIN DIRECT: 166 mg/dL (ref 0–189)
TRIGLYCERIDES: 112 mg/dL (ref 0–150)

## 2013-06-27 MED ORDER — PHENAZOPYRIDINE HCL 100 MG PO TABS
100.0000 mg | ORAL_TABLET | Freq: Three times a day (TID) | ORAL | Status: AC | PRN
Start: 2013-06-27 — End: 2013-06-30

## 2013-06-27 MED ORDER — NITROFURANTOIN MONOHYD MACRO 100 MG PO CAPS
100.00 mg | ORAL_CAPSULE | Freq: Two times a day (BID) | ORAL | Status: AC
Start: 2013-06-27 — End: 2013-07-04

## 2013-06-27 NOTE — Progress Notes (Signed)
VIS given prior to administration and reviewed with the patient and or legal guardian. Patient understands the disease and the vaccine. See immunization/Injection module or chart review for date of publication and additional information.

## 2013-06-28 LAB — URINE CULTURE: URINE CULTURE/COLONY COUNT: NO GROWTH

## 2013-09-21 ENCOUNTER — Ambulatory Visit (HOSPITAL_BASED_OUTPATIENT_CLINIC_OR_DEPARTMENT_OTHER): Payer: PRIVATE HEALTH INSURANCE | Admitting: Gastroenterology

## 2013-09-21 VITALS — BP 117/93 | HR 67 | Temp 98.0°F | Wt 184.0 lb

## 2013-09-21 DIAGNOSIS — K219 Gastro-esophageal reflux disease without esophagitis: Principal | ICD-10-CM

## 2013-09-21 MED ORDER — RANITIDINE HCL 300 MG PO TABS
300.0000 mg | ORAL_TABLET | Freq: Every evening | ORAL | Status: DC
Start: 2013-09-21 — End: 2013-10-25

## 2013-09-21 MED ORDER — OMEPRAZOLE 20 MG PO CPDR
20.0000 mg | DELAYED_RELEASE_CAPSULE | Freq: Two times a day (BID) | ORAL | Status: DC
Start: 2013-09-21 — End: 2013-10-25

## 2013-09-21 NOTE — Progress Notes (Signed)
Here for epigastric discomfort. Two years ago had H Pylori. Was treated. Symptoms have returned. Had an endoscopy in August in [place]. Found gastritis and esophagitis. Has been on medication for 3 month.went back to doctor in November and had a repeat endoscopy and was not cured.      Safe at home

## 2013-09-21 NOTE — Progress Notes (Addendum)
Tammy Seminole, MD  [address] [facility]  [place], [place] [address]    Dear Dr. Helayne Seminole, MD,    It is with great pleasure that I am seeing our mutual patient in the GI clinic at the [facility].  Reason For Consult:   I am asked to see our patient in clinical consultation for evaluation of a chief complaint of Esophageal reflux  (primary encounter diagnosis)    Chief Complaint: Evaluation of this chief complaint led to the following visit diagnoses:   Esophageal reflux  (primary encounter diagnosis)  History of the Present Illness: Patient is a 48 year old female who presents with midepigastric burning and abdominal discomfort.  Symptoms are worse at night and also with recumbency.She has a known history of Helicobacter pylori infection.  Child denies any melena, hematochezia, dysphagia or odynophagia.  She has no personal or family history of gastritic cancer.  Taken together her symptoms are concerning for gastroesophageal reflux disease.  I have elected to increase her dose of Prilosec to 20 milligrams twice a day and started on 30 milligrams of ranitidine at night.    Problem List:  Patient Active Problem List:     FH: diabetes mellitus     Iron deficiency anemia     H. pylori infection    Past Surgical History:      Past Surgical History    ------------OTHER-------------  1999    Comment plastic surgery - abdominoplasty    ------------OTHER-------------  2005    Comment pelvic procedure in [place]     Social History:    Smoking Status: Never Smoker                      Smokeless Status: Never Used                        Alcohol Use: No              Family History:    Family History    Diabetes Mother     Cancer - Breast FamHxNeg     Cancer - Cervical FamHxNeg     Cancer - Colon FamHxNeg     Heart Mother     Comment: s/p bypass surgery    Hypertension Mother      Allergies:  Review of Patient's Allergies indicates:   Chlorpromazine          Anaphylaxis  Current  Medications:    Current Outpatient Prescriptions:  RABEprazole (ACIPHEX) 20 MG tablet Take 20 mg by mouth daily. Disp:  Rfl:    Biotin 1000 MCG TABS Tablet Take 1,000 mcg by mouth daily. Disp:  Rfl:    omeprazole (PRILOSEC) 20 MG capsule Take 1 capsule by mouth 2 (two) times daily before meals. Disp: 60 capsule Rfl: 5   ranitidine (ZANTAC) 300 MG tablet Take 1 tablet by mouth nightly. Disp: 90 tablet Rfl: 4   atorvastatin (LIPITOR) 40 MG tablet Take 1 tablet by mouth daily. Disp: 30 tablet Rfl: 11     No current facility-administered medications for this visit.  Review of Systems:  All systems were otherwise negative except for the GI and other systems documented in the History of the Present Illness.  Physical Exam:  The patient is sitting in the examining table and appears to be in no apparent distress.  BP 117/93   Pulse 67   Temp(Src) 98 F (36.7 C) (Oral)   Wt 184 lb (  83.462 kg)   BMI 35.42 kg/m2   SpO2 100%   LMP 11/01/2012  HEENT: Normocephalic atraumatic  Oropharynx: Clear with no erythema or exudates  Neck: Supple no palpable lymphadenopathy in the neck or the axilla  Lungs: Clear to ausculation bilaterally  Heart: Regular rate and rhythm,  no murmurs rubs or gallops  ZOX:WRUEAbd:Soft non tender with positive bowel sounds.  Extremities: No swelling or edema  Skin: No rashes or lesions  Neuro: Alert and oriented to person , place and time    A/P:  Esophageal reflux  (primary encounter diagnosis)  Active GI  Problems Addressed During This Visit (Visit Diagnoses):    Procedures/Test ordered: An upper endoscopy was ordered today for further violation of her clinical symptoms.    Impression/Plan: In summary this is a 48 year old female with GERD symptoms plan is for treatment with acid suppressive medicine and also diagnostic followup and evaluation via upper endoscopy.  She'll followup with me after the performance of this procedure.  Of note she has a history of Helicobacter pylori which was successfully treated back  in 2013.      Sincerely,    Farley LyAlphonso Brown MD, MS Clin EPI    AV:WUJWJCC:Laura Loralee Pacasarman, MD

## 2013-10-14 ENCOUNTER — Emergency Department (HOSPITAL_BASED_OUTPATIENT_CLINIC_OR_DEPARTMENT_OTHER)
Admission: RE | Admit: 2013-10-14 | Disposition: A | Discharge status: Home / Self Care | Payer: Self-pay | Source: Emergency Department | Attending: Emergency Medicine | Admitting: Emergency Medicine

## 2013-10-14 ENCOUNTER — Encounter (HOSPITAL_BASED_OUTPATIENT_CLINIC_OR_DEPARTMENT_OTHER): Payer: Self-pay | Admitting: Student in an Organized Health Care Education/Training Program

## 2013-10-14 ENCOUNTER — Telehealth (HOSPITAL_BASED_OUTPATIENT_CLINIC_OR_DEPARTMENT_OTHER): Payer: Self-pay | Admitting: Internal Medicine

## 2013-10-14 HISTORY — DX: Disorder of lipoprotein metabolism, unspecified: E78.9

## 2013-10-14 LAB — POC URINALYSIS
BILIRUBIN, URINE: NEGATIVE
GLUCOSE,URINE: NEGATIVE
KETONE, URINE: NEGATIVE
LEUKOCYTE ESTERASE: NEGATIVE
NITRITE, URINE: NEGATIVE
OCCULT BLOOD, URINE: NEGATIVE
PH URINE: 7 (ref 5.0–8.0)
PROTEIN, URINE: NEGATIVE
SPECIFIC GRAVITY, URINE: 1.015 (ref 1.003–1.030)
UROBILINOGEN URINE: 0.2 (ref 0.2–1.0)

## 2013-10-14 LAB — CARBOXYHEMOGLOBIN: CARBOXYHEMOGLOBIN: 2.3 % (ref 0.0–4.0)

## 2013-10-14 LAB — URINE PREGNANCY TEST (POINT OF CARE): HCG QUALITATIVE URINE: NEGATIVE

## 2013-10-14 MED ORDER — ONDANSETRON 4 MG PO TBDP
4.00 mg | ORAL_TABLET | Freq: Once | ORAL | Status: AC
Start: 2013-10-14 — End: 2013-10-14
  Administered 2013-10-14: 4 mg via ORAL
  Filled 2013-10-14: qty 1

## 2013-10-14 MED ORDER — ONDANSETRON HCL 4 MG PO TABS
4.00 mg | ORAL_TABLET | Freq: Three times a day (TID) | ORAL | Status: AC | PRN
Start: 2013-10-14 — End: 2013-10-19

## 2013-10-14 MED ORDER — IBUPROFEN 600 MG PO TABS
600.00 mg | ORAL_TABLET | Freq: Once | ORAL | Status: AC
Start: 2013-10-14 — End: 2013-10-14
  Administered 2013-10-14: 600 mg via ORAL
  Filled 2013-10-14: qty 1

## 2013-10-14 NOTE — ED Notes (Signed)
Patient Disposition    Patient education for diagnosis, medications, activity, diet and follow-up.  Patient left ED 4:51 PM.  Patient rep received written instructions.  Interpreter to provide instructions: No    Discharged to: Discharged to home with husband    Pt A+Ox4. Clear speech. Steady gait and balance. Regular non labored breathing. Skin warm and dry. Pt offers no complaints. No acute distress observed. Pt leaving dept ambulatory without difficulty. Declines w/c.

## 2013-10-14 NOTE — Discharge Instructions (Signed)
Your blood showed a normal level of carbon monoxide. You received appropriate treatment with oxygen. The following is information about carbon monoxide poisoning to look out for in the future.     Carbon Monoxide Poisoning  Carbon monoxide poisoning is illness caused by inhaling carbon monoxide gas. Carbon monoxide is a colorless, odorless gas. When the gas is inhaled, it quickly enters the bloodstream and reduces the amount of oxygen that goes to your cells. This decrease in oxygen received by the body tissues quickly becomes a life-threatening problem. Carbon monoxide poisoning is a medical emergency that requires immediate medical care. People who are elderly or who have heart disease or lung disease are more likely to have ill effects from carbon monoxide poisoning.  CAUSES   Carbon monoxide poisoning is often caused by inhaling exhaust fumes from fuel burning sources. Burning any carbon-containing fuel (gasoline, coal, charcoal, wood) releases carbon monoxide into the air. Sources of carbon monoxide fumes include:   Motor exhaust from cars, motorcycles, and boats.   Cigarette smoke.   Propane-powered tools and vehicles.   Gas-powered tools and other Biomedical engineerindustrial equipment.  Without proper ventilation, carbon monoxide can build up in an enclosed or partially enclosed area. For example, if a chimney is not clear, a camping stove is used indoors, or a car is left running in a closed garage, this can lead to carbon monoxide poisoning.  SYMPTOMS    Headache.   Dizziness.   Sleepiness.   Weakness.   Confusion.   Fainting.   Seizures.   Coma.   Nausea.   Vomiting.   Chest pain.   Shortness of breath.  DIAGNOSIS   Your caregiver will probably suspect that you have carbon monoxide poisoning based on your symptoms and history of possible exposure. A blood test may be done to confirm the diagnosis.  TREATMENT   Treatment involves getting to fresh air immediately and removing yourself from the dangerous  environment. In the emergency room, you may be given oxygen therapy. Oxygen can be delivered through a face mask or breathing tubes that fit under your nostrils. In some severe cases, hyperbaric oxygen therapy may be used. For this treatment, the person enters a chamber where oxygen is delivered under forced pressure. This speeds up the process in which oxygen is absorbed and replaces the carbon monoxide in the blood.  PREVENTION    Install a carbon monoxide detector in your home.   Have all gas stoves and furnaces inspected once a year.   Clean all fireplace chimneys and flues at least once a year.   Have the exhaust system in your car checked once a year.   Never keep a car's motor running in a closed garage.   Never sleep in a car with the motor running.   Make sure all rooms that are heated with gasoline, coal, charcoal, or wood are properly ventilated.  HOME CARE INSTRUCTIONS   Do not return to the area where you were exposed to carbon monoxide. The area must be thoroughly ventilated before it is safe to return.  SEEK IMMEDIATE MEDICAL CARE IF:  You suspect that a person has inhaled carbon monoxide gas. Remove the person from the area immediately. Call your local emergency services (911 in [place] Begin rescue breathing if the person is unconscious.  MAKE SURE YOU:    Understand these instructions.   Will watch your condition.   Will get help right away if you are not doing well or get worse.  Document Released:  08/21/2000 Document Revised: 02/23/2012 Document Reviewed: 11/12/2011  [facility] Patient Information 2014 [place], [place].

## 2013-10-14 NOTE — Progress Notes (Signed)
Night/Weekend Call:    Message: "headaches, nausea, vomiting, theres a carbon monoxide leak in home, leak is where shes been sleeping, nostrils black"  Page time: 13:27  Call back: 13:40 (with patient in clinic)    Spoke with daughter - patient has symptoms above, SOB at times but not this second.  No CP, confusion  Encouraged her to bring patient to ED for concern over above - uncertain if carbon monoxide poisoning or other  [facility]  [facility]    Will FYI PCP to make aware  Anil Havard P. Kermit Arnette,MD, 10/14/2013, 1:48 PM

## 2013-10-14 NOTE — ED Notes (Signed)
Bed: 01  Expected date: 10/14/13  Expected time:   Means of arrival:   Comments:  Burman RiisSilva, Jhane    47yoF with  Sob, ha, n/v, blackness around nose    Call from daughter in law that there is CO leak in house    [facility]ons [facility]

## 2013-10-14 NOTE — ED Provider Notes (Signed)
eMERGENCY dEPARTMENT resident NOTE    The ED nursing record was reviewed.   The prior medical records as available electronically through Epic were reviewed.  The mode of arrival was Self on 10/14/2013  3:08 PM.    This patient was seen with Emergency Department attending physician Dr. Logan Bores    CHIEF COMPLAINT    Patient presents with:    VERIFY CHIEF COMPLAINT - NAUSEA,HEDACHE,SOB-ID    HPI    Tammy Bradford is a 48 year old female who presents with concern of potential carbon monoxide exposure. Reports that she had two episodes of emesis, HA and abdominal pain yesterday and felt she probably had a viral illness. She reported that this morning she felt like she smelled something strange and was concerned because she saw black soot in her nostrils so she called her landlord and they determined that the water heater in her home was emitting carbon monoxide. She called her PCP who advised her to come into the ED for evaluation. She reports feeling better today but was concerned about potential carbon monoxide exposure. Took her normal medications today.     Denies diarrhea, fevers/chills, abdominal pain, SOB, coughing.     PAST MEDICAL HISTORY      Past Medical History    FH: diabetes mellitus 11/01/2012    Comment: Mother is diabetic     PROBLEM LIST  Patient Active Problem List:     FH: diabetes mellitus     Iron deficiency anemia     H. pylori infection    SURGICAL HISTORY        Past Surgical History    ------------OTHER-------------  1999    Comment plastic surgery - abdominoplasty    ------------OTHER-------------  2005    Comment pelvic procedure in [place]     CURRENT MEDICATIONS    1. RABEprazole (ACIPHEX) 20 MG tablet  Route: Oral EAV:WUJW 20 mg by mouth daily.  Dispense:  Refill:     2. omeprazole (PRILOSEC) 20 MG capsule  Route: Oral JXB:JYNW 1 capsule by mouth 2 (two) times daily before meals.  Dispense: 60 capsule Refill: 5    3. ranitidine (ZANTAC) 300 MG tablet  Route: Oral GNF:AOZH 1 tablet by mouth  nightly.  Dispense: 90 tablet Refill: 4    4. Biotin 1000 MCG TABS Tablet  Route: Oral YQM:VHQI 1,000 mcg by mouth daily.  Dispense:  Refill:     5. EXPIRED: atorvastatin (LIPITOR) 40 MG tablet  Route: Oral ONG:EXBM 1 tablet by mouth daily.  Dispense: 30 tablet Refill: 11    ALLERGIES    Review of Patient's Allergies indicates:   Chlorpromazine          Anaphylaxis    FAMILY HISTORY      Family History    Diabetes Mother     Cancer - Breast FamHxNeg     Cancer - Cervical FamHxNeg     Cancer - Colon FamHxNeg     Heart Mother     Comment: s/p bypass surgery    Hypertension Mother        SOCIAL HISTORY    Social History    Marital Status: Married             Spouse Name:                       Years of Education:                 Number of children:  Social History Main Topics    Smoking Status: Never Smoker                      Smokeless Status: Never Used                        Alcohol Use: No              Drug Use: No              Sexual Activity: Yes               Partners with: Female       Birth Control/Protection: Vasectomy    Other Topics            Concern  Blood Transfusions      No  Seat Belt               Yes    Social History Narrative    Born in [place] - living in [place] x 9 yrs.    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    2 children - ages 4919 and 5323; has step grandchildren.    REVIEW OF SYSTEMS    The pertinent positives are reviewed in the HPI above. All other systems were reviewed and are negative.    PHYSICAL EXAM      Vital Signs:BP 159/95   Pulse 66   Temp(Src) 98.2 F   Resp 18   Wt 80 kg (176 lb 5.9 oz)   BMI 33.95 kg/m2   SpO2 100%   LMP 10/07/2013   Constitutional: Well-developed, Well-nourished, Non-toxic appearance. Speaking full sentences.  Distress: None  HEAD: NCAT, Without signs of trauma.    EYES:Pupils are equal and reactive. Patient is wearing colored contact lenses.   ENT: Clear. Mucus membranes are moist.   CV: RRR, No MRG  PULMONARY:  CTAB, no respiratory  distress  ABDOMINAL: Soft, NTND  MUSCULOSKELETAL: Moving all 4 extremities.  NEUROLOGIC: Normal mental status. AO x 3.    RESULTS  Results for orders placed during the hospital encounter of 10/14/13 (from the past 24 hour(s))   CARBOXYHEMOGLOBIN    Collection Time     10/14/13  3:50 PM       Result Value    CARBOXYHEMOGLOBIN 2.3     POC URINALYSIS    Collection Time     10/14/13  3:54 PM       Result Value    COLOR YELLOW      CLARITY CLEAR      GLUCOSE,URINE NEGATIVE      BILIRUBIN, URINE NEGATIVE      KETONE, URINE NEGATIVE      SPECIFIC GRAVITY, URINE 1.015      UROBILINOGEN URINE 0.2      OCCULT BLOOD, URINE NEGATIVE      PH URINE 7.0      PROTEIN, URINE NEGATIVE      NITRITE, URINE NEGATIVE      LEUKOCYTE ESTERASE NEGATIVE     URINE PREGNANCY TEST (POINT OF CARE)    Collection Time     10/14/13  3:58 PM       Result Value    HCG QUALITATIVE URINE Negative      ONBOARD CONTROL PRESENT? Yes        RADIOLOGY  None    EKG:  None    PROCEDURES  None    MEDICATIONS ADMINISTERED ON THIS VISIT  Medication Orders Placed This Encounter  ondansetron (ZOFRAN-ODT) disintegrating tablet 4 mg   Sig:   ibuprofen (ADVIL,MOTRIN) tablet 600 mg   Sig:   ondansetron (ZOFRAN) 4 MG tablet   Sig: Take 1 tablet by mouth every 8 (eight) hours as needed for Nausea.   Dispense:  15 tablet   Refill:  0    ED COURSE & MEDICAL DECISION MAKING      I reviewed the patient's past medical history/problem list, past surgical history, medication list, social history and allergies.    Arrival: Pt arrived in stable condition and required no immediate interventions.    ED Decision Making & Course: Pt is a 48 year old female who presents with concern for carbon monoxide poisoning. She was hemodynamically stable and appeared well on exam. No neurologic deficits noted and no respiratory distress. She was given oxygen via NRB for approximately 90 minutes and tolerated it well. Carboxyhemoglobin levels were within normal limits.     Given she does not  exhibit any signs or symptoms of carbon monoxide poisoning, she is stable to be discharged to home.     Patient was discharged to home with Zofran for nausea and instructions to follow up with PCP. Patient counseled on warning signs to return to ED if symptoms fail to improve or worsen. Patient and family expressed understanding.     Follow Up: With PCP    Petra Kuba.Ardeen Jourdain, MD, 10/14/2013, 3:09 PM

## 2013-10-14 NOTE — ED Triage Note (Signed)
Pt arrives ambulatory to er rm 1. Pt states "yesterday morning I started having nausea and I was throwing up. Last night I began to feel dizzy and have a headache. I noticed today there was black soot in my bathroom and in my nose. I had the house technician come and they said there was high carbon monoxide levels. My doctor told me to come here."    A+Ox4. Clear speech. PERRLA bilat. Moving all extremities without difficulty. 100% NRB put into place. Regular non labored breathing. Skin warm and dry. No vomiting. Pt c/o headache at this time. No acute distress observed.

## 2013-10-18 ENCOUNTER — Telehealth (HOSPITAL_BASED_OUTPATIENT_CLINIC_OR_DEPARTMENT_OTHER): Payer: Self-pay

## 2013-10-18 DIAGNOSIS — E781 Pure hyperglyceridemia: Principal | ICD-10-CM | POA: Insufficient documentation

## 2013-10-18 NOTE — Progress Notes (Signed)
Pt was seen in ER 10/14/13  with Headache , nausea & SOB after a question of carbon monoxide exposure.  Pt's labs were normal & she was released.  Pt still feels well.  Her water heater has been repaired so she is not concerned about any future exposures.    Pt wants to make a f/u appt to see Dr Loralee Pacasarman & discuss her cholesterol levels.  She also has an appt with Medical Specialties Dr Manson PasseyBrown for follow up of abdominal pain.  Pt will do fasting lipid labs before her fu appt with Dr Loralee Pacasarman.  Orders need to be written.  Pt demonstrates understanding & accepts plan

## 2013-10-18 NOTE — Progress Notes (Signed)
Lipid order done

## 2013-10-23 NOTE — ED Provider Notes (Signed)
This 48 year old female patient was evaluated in the ED for possible carbon monoxide exposure.  Initial history and physical exam information was obtained by Woody SellerEmily Samaha, MD, who also made a detailed record of this visit. I performed a history and physical examination of the patient and discussed management with Irving BurtonEmily. I reviewed Emily's note and agree with the documented findings and plan of care.    I reviewed the patient's past medical history/problem list, past surgical history, medication list, social history and allergies.  I made all key diagnostic, treatment and disposition decisions.      The patient had 2 episodes of vomiting, headache and abdominal pain yesterday, thought she had a virus.  Today she smelled something in her home and saw black material like soot in her nose, so she called her landlord and the gas company determined that there was excess carbon monoxide in her home.  She called her PCP, who suggested she be evaluated here.  No headache today.  No vomiting today.  She does feel improved from yesterday.  For the remainder of the history, please see Emily's note.      On my direct physical exam:  Patient Vitals for the past 720 hrs:   Temp Pulse Resp BP SpO2   10/14/13 1519 98.2 F 66 18 159/95 mmHg 100 %   10/14/13 1531 - 68 - 130/84 mmHg 100 %   10/14/13 1601 - 57 16 125/80 mmHg 100 %   10/14/13 1631 - 61 - 125/73 mmHg 100 %   10/14/13 1648 - 59 18 - 100 %       GENERAL:  Awake, alert, in no acute respiratory distress.  HEENT:  NCAT, PERRL, EOMI, OP clear, MMM.  CV:  RRR, normal S1 and S2, no murmur noted.  LUNGS:  CTA bilaterally.  ABDOMEN:  Soft, normal bowel sounds.  Nontender to palpation throughout.  For the remainder of the physical exam, please see Emily's note.      Key lab findings:    Labs Reviewed   CARBOXYHEMOGLOBIN   POC URINALYSIS   URINE PREGNANCY TEST (POINT OF CARE)     CO was normal at 2.3, urine was negative.    Treatment:  Patient was placed on 100% NRB while we waited  for the CO level to return.  It was normal.  She does not require any further treatment.  She should f/u with her PCP.    Reasons to return to the ED were reviewed in detail. The patient agrees with this plan and disposition.      Diagnosis/diagnoses:  Nausea  Headache  Viral illness      Elouise MunroeAdelaide J Rondey Fallen

## 2013-10-25 ENCOUNTER — Ambulatory Visit (HOSPITAL_BASED_OUTPATIENT_CLINIC_OR_DEPARTMENT_OTHER): Payer: PRIVATE HEALTH INSURANCE | Admitting: Gastroenterology

## 2013-10-25 ENCOUNTER — Encounter (HOSPITAL_BASED_OUTPATIENT_CLINIC_OR_DEPARTMENT_OTHER): Payer: Self-pay | Admitting: Gastroenterology

## 2013-10-25 VITALS — BP 130/80 | HR 79 | Temp 98.2°F | Wt 134.0 lb

## 2013-10-25 DIAGNOSIS — K219 Gastro-esophageal reflux disease without esophagitis: Principal | ICD-10-CM

## 2013-10-25 MED ORDER — OMEPRAZOLE 20 MG PO CPDR
20.0000 mg | DELAYED_RELEASE_CAPSULE | Freq: Two times a day (BID) | ORAL | Status: AC
Start: 2013-10-25 — End: 2013-12-24

## 2013-10-25 MED ORDER — RANITIDINE HCL 300 MG PO TABS
300.0000 mg | ORAL_TABLET | Freq: Every evening | ORAL | Status: AC
Start: 2013-10-25 — End: 2013-11-24

## 2013-10-25 MED ORDER — FLUOXETINE HCL 20 MG PO CAPS
20.00 mg | ORAL_CAPSULE | Freq: Every day | ORAL | Status: AC
Start: 2013-10-25 — End: 2013-11-24

## 2013-10-25 NOTE — Progress Notes (Signed)
Pt here for abdominal pain. Denies chest pain. Denies abdominal. Pt stated having sob due to walking. Feels safe at home. Med list reviewed with pt. Pain score (0/10).

## 2013-10-25 NOTE — Progress Notes (Addendum)
Helayne Seminole, MD  [address] [facility]  [place], [place] [address]    Dear Dr. Helayne Seminole, MD,    It is with great pleasure that I am seeing our mutual patient in the GI clinic at the [facility].  Reason For Consult:   I am asked to see our patient in clinical consultation for evaluation of a chief complaint of Esophageal reflux  (primary encounter diagnosis)  Chief Complaint: Evaluation of this chief complaint led to the following visit diagnoses:   Esophageal reflux  (primary encounter diagnosis)  History of the Present Illness:  Office Interview notes regarding Chief complaint:  Reason here:Esophageal reflux  (primary encounter diagnosis)  Summary: This is a followup visit for a patient who comes in today for followup of a known history of symptoms of gastroesophageal reflux disease.  We discussed her treatment and management strategy and I am happy to hear that she's doing on a combination of Zantac and omeprazole.  Problem List:  Patient Active Problem List:     FH: diabetes mellitus     Iron deficiency anemia     H. pylori infection     Pure hyperglyceridemia    Past Surgical History:      Past Surgical History    ------------OTHER-------------  1999    Comment plastic surgery - abdominoplasty    ------------OTHER-------------  2005    Comment pelvic procedure in [place]     Social History:    Smoking Status: Never Smoker                      Smokeless Status: Never Used                        Alcohol Use: No              Family History:    Family History    Diabetes Mother     Cancer - Breast FamHxNeg     Cancer - Cervical FamHxNeg     Cancer - Colon FamHxNeg     Heart Mother     Comment: s/p bypass surgery    Hypertension Mother      Allergies:  Review of Patient's Allergies indicates:   Chlorpromazine          Anaphylaxis  Current Medications:    Current Outpatient Prescriptions:  ranitidine (ZANTAC) 300 MG tablet Take 1 tablet by mouth nightly. Disp: 30 tablet Rfl: 4    omeprazole (PRILOSEC) 20 MG capsule Take 1 capsule by mouth 2 (two) times daily before meals. Disp: 60 capsule Rfl: 5   FLUoxetine (PROZAC) 20 MG capsule Take 1 capsule by mouth daily. Disp: 30 capsule Rfl: 0   RABEprazole (ACIPHEX) 20 MG tablet Take 20 mg by mouth daily. Disp:  Rfl:    Biotin 1000 MCG TABS Tablet Take 1,000 mcg by mouth daily. Disp:  Rfl:      No current facility-administered medications for this visit.  Review of Systems:  All systems were otherwise negative except for the GI and other systems documented in the History of the Present Illness.  Physical Exam:  The patient is sitting in the examining table and appears to be in no apparent distress.  BP 130/80   Pulse 79   Temp(Src) 98.2 F (36.8 C) (Oral)   Wt 134 lb (60.782 kg)   BMI 25.8 kg/m2   SpO2 100%   LMP 10/07/2013  HEENT: Normocephalic atraumatic  Oropharynx: Clear with no erythema or exudates  Neck: Supple no palpable lymphadenopathy in the neck or the axilla  Lungs: Clear to ausculation bilaterally  Heart: Regular rate and rhythm,  no murmurs rubs or gallops  UEA:VWUJAbd:Soft non tender with positive bowel sounds.  Extremities: No swelling or edema  Skin: No rashes or lesions  Neuro: Alert and oriented to person , place and time    A/P:  Esophageal reflux  (primary encounter diagnosis)  Active GI  Problems Addressed During This Visit (Visit Diagnoses):    SUMMARY OF THE CLINICAL VISIT: We discussed her overall treatment strategy and electrolytes been so effective at managing her reflux symptoms.    Procedures/Test ordered: No new procedures or test were ordered today.      Impression/Plan: This is a 48 year old female with a history of gastroesophageal reflux disease and clinical symptoms.  The management has consisted of treatment with a combination of a PPI and H2 blocker.  She'll followup with me in approximately 2-3 months so I can continue to assess her clinical response to therapy.  Overall however she is doing well.  I wish to thank  you for this referral and for the opportunity to participate in her care.      Sincerely,    Farley LyAlphonso Brown MD, MS Clin EPI    WJ:XBJYNCC:Laura Loralee Pacasarman, MD        Primary note dictated through E-Scription.  See "Notes" tab in Chart Review for transcribed note; final note also appears within encounter.

## 2013-10-30 ENCOUNTER — Ambulatory Visit (HOSPITAL_BASED_OUTPATIENT_CLINIC_OR_DEPARTMENT_OTHER): Payer: PRIVATE HEALTH INSURANCE

## 2013-10-30 DIAGNOSIS — E781 Pure hyperglyceridemia: Principal | ICD-10-CM

## 2013-10-30 LAB — LIPID PANEL
Cholesterol: 267 mg/dL (ref 0–239)
HIGH DENSITY LIPOPROTEIN: 62 mg/dL (ref 40–?)
LOW DENSITY LIPOPROTEIN DIRECT: 187 mg/dL (ref 0–189)
TRIGLYCERIDES: 85 mg/dL (ref 0–150)

## 2013-10-30 NOTE — Progress Notes (Signed)
Labs drawn. Garay-Gamez,Johncharles Fusselman Clemmons, 10/30/2013, 10:30 AM

## 2013-10-31 ENCOUNTER — Encounter (HOSPITAL_BASED_OUTPATIENT_CLINIC_OR_DEPARTMENT_OTHER): Payer: Self-pay | Admitting: Emergency Medicine

## 2013-10-31 ENCOUNTER — Other Ambulatory Visit (HOSPITAL_BASED_OUTPATIENT_CLINIC_OR_DEPARTMENT_OTHER): Payer: Self-pay | Admitting: Emergency Medicine

## 2013-10-31 ENCOUNTER — Ambulatory Visit (HOSPITAL_BASED_OUTPATIENT_CLINIC_OR_DEPARTMENT_OTHER): Payer: PRIVATE HEALTH INSURANCE | Admitting: Emergency Medicine

## 2013-10-31 VITALS — BP 130/80 | HR 63 | Temp 99.7°F | Wt 184.0 lb

## 2013-10-31 DIAGNOSIS — D509 Iron deficiency anemia, unspecified: Principal | ICD-10-CM

## 2013-10-31 DIAGNOSIS — E781 Pure hyperglyceridemia: Principal | ICD-10-CM

## 2013-10-31 MED ORDER — FLUOXETINE HCL 20 MG PO TABS
20.0000 mg | ORAL_TABLET | Freq: Every day | ORAL | Status: DC
Start: 2013-10-31 — End: 2014-01-30

## 2013-10-31 NOTE — Patient Instructions (Signed)
Activity - better small amounts frequently then a lot at once.

## 2013-10-31 NOTE — Progress Notes (Signed)
Tammy Bradford is a 48 year old female  Cholesterol discussion    Hyperlipidemia -   Didn't tolerate atorvastatin -cause myalgias.  Has been off medication for most recent blood test.    LDL DIRECT (mg/dL)   Date  Value    0/98/11912/23/2015  187     06/27/2013  166     12/14/2012  88    ----------    We reviewed the risk calculator.  CV 10 yr risk is 1.3%.  Discussed medication - or vegan diet.  Also discussed benefit of weight loss.  She is in favor of losing 20 kg.        I      Physical Exam   Nursing note and vitals reviewed.  Constitutional: She is oriented to person, place, and time. She appears well-developed. No distress.   Estimated body mass index is 25.80 kg/(m^2) as calculated from the following:    Height as of 11/01/12: 5' 0.43" (1.535 m).    Weight as of 10/25/13: 134 lb (60.782 kg).     Neurological: She is alert and oriented to person, place, and time.   Skin: Skin is warm and dry.   Psychiatric: She has a normal mood and affect. Her behavior is normal.     ASSESSMENT/PLAN:  (272.1) Pure hyperglyceridemia  (primary encounter diagnosis)  Comment: she will work on diet and exercise and return in 3 months.  Plan: LIPID PANEL, COLLECTION VENOUS BLOOD         VENIPUNCTURE

## 2013-11-05 NOTE — Progress Notes (Signed)
Quick Note:    Results rviewed at office visit 11/01/2013.  ______

## 2013-12-27 ENCOUNTER — Other Ambulatory Visit (HOSPITAL_BASED_OUTPATIENT_CLINIC_OR_DEPARTMENT_OTHER): Payer: Self-pay | Admitting: Gastroenterology

## 2014-01-30 ENCOUNTER — Encounter (HOSPITAL_BASED_OUTPATIENT_CLINIC_OR_DEPARTMENT_OTHER): Payer: Self-pay | Admitting: Emergency Medicine

## 2014-01-30 ENCOUNTER — Emergency Department (HOSPITAL_BASED_OUTPATIENT_CLINIC_OR_DEPARTMENT_OTHER)
Admission: RE | Admit: 2014-01-30 | Disposition: A | Discharge status: Home / Self Care | Payer: Self-pay | Source: Emergency Department | Attending: Emergency Medicine | Admitting: Emergency Medicine

## 2014-01-30 LAB — POC URINALYSIS
BILIRUBIN, URINE: NEGATIVE
GLUCOSE,URINE: NEGATIVE
KETONE, URINE: NEGATIVE
NITRITE, URINE: NEGATIVE
OCCULT BLOOD, URINE: NEGATIVE
PH URINE: 7 (ref 5.0–8.0)
SPECIFIC GRAVITY, URINE: 1.02 (ref 1.003–1.030)
UROBILINOGEN URINE: 0.2 (ref 0.2–1.0)

## 2014-01-30 LAB — URINALYSIS
BILIRUBIN, URINE: NEGATIVE
CASTS: NONE SEEN PER LPF
CRYSTALS: NONE SEEN
GLUCOSE, URINE: NEGATIVE MG/DL
KETONE, URINE: NEGATIVE MG/DL
NITRITE, URINE: NEGATIVE
OCCULT BLOOD, URINE: NEGATIVE
PH URINE: 7 (ref 5.0–8.0)
PROTEIN, URINE: NEGATIVE MG/DL
RED BLOOD CELLS URINE: NONE SEEN PER HPF (ref 0–2)
SPECIFIC GRAVITY URINE: 1.02 (ref 1.003–1.035)

## 2014-01-30 LAB — URINE PREGNANCY TEST (POINT OF CARE): HCG QUALITATIVE URINE: NEGATIVE

## 2014-01-30 LAB — CT HEAD WO CONTRAST

## 2014-01-30 MED ORDER — SODIUM CHLORIDE 0.9 % IV BOLUS
1000.00 mL | Freq: Once | INTRAVENOUS | Status: AC
Start: 2014-01-30 — End: 2014-01-30
  Administered 2014-01-30: 1000 mL via INTRAVENOUS

## 2014-01-30 MED ORDER — DEXAMETHASONE SODIUM PHOSPHATE 10 MG/ML IJ SOLN
10.00 mg | Freq: Once | INTRAMUSCULAR | Status: AC
Start: 2014-01-30 — End: 2014-01-30
  Administered 2014-01-30: 10 mg via INTRAVENOUS
  Filled 2014-01-30: qty 1

## 2014-01-30 MED ORDER — DIPHENHYDRAMINE HCL 25 MG PO CAPS
25.00 mg | ORAL_CAPSULE | Freq: Once | ORAL | Status: AC
Start: 2014-01-30 — End: 2014-01-30
  Administered 2014-01-30: 25 mg via ORAL
  Filled 2014-01-30: qty 1

## 2014-01-30 MED ORDER — METOCLOPRAMIDE HCL 5 MG/ML IJ SOLN
20.00 mg | Freq: Once | INTRAMUSCULAR | Status: AC
Start: 2014-01-30 — End: 2014-01-30
  Administered 2014-01-30: 20 mg via INTRAVENOUS
  Filled 2014-01-30: qty 4

## 2014-01-30 MED ORDER — NITROFURANTOIN MONOHYD MACRO 100 MG PO CAPS
100.00 mg | ORAL_CAPSULE | Freq: Two times a day (BID) | ORAL | Status: AC
Start: 2014-01-30 — End: 2014-02-06

## 2014-01-30 NOTE — ED Notes (Signed)
To ct dept now

## 2014-01-30 NOTE — ED Triage Note (Signed)
Pt arrives ambulatory to er rm 28 with c/o left sided headache that began 01/29/14 at 1900. Altered sensation and numbness to left side of body since 01/30/14 0000. Pt reports pain in head increased at 0400 accompanied by vomiting and diarrhea. Pt reports daughter currently ER pt with similar GI sxs. Pt also verbalizing concerns r/t paternal death at age 48 r/t CVA.

## 2014-01-30 NOTE — ED Notes (Addendum)
To ct dept now

## 2014-01-30 NOTE — Discharge Instructions (Signed)
Return to the ER for increased pain, different pain, pain that does not resolve, fevers, nausea or vomiting, neck pain or neck stiffness, confusion, weakness or numbness, any other concerns.      Please followup with your primary care physician in 2 days for reexamination and reevaluation to assess for clinical improvement.

## 2014-01-30 NOTE — ED Notes (Signed)
Patient Disposition    Patient education for diagnosis, medications, activity, diet and follow-up.  Patient left ED 1:28 PM.  Patient rep received written instructions.  Interpreter to provide instructions: No    Have all existing LDAs been addressed? N/A    Have all IV infusions been stopped? N/A    Discharged to: Discharged to home accepting of all instructions.

## 2014-01-30 NOTE — ED Provider Notes (Signed)
The patient was seen primarily by me. ED nursing record was reviewed. Prior records as available electronically through the Epic record were reviewed.    Patient's mode of arrival was by Relative.  Arrival time: 01/30/2014 10:42 AM  Chief complaint: Acute Neurological Problem; Vomiting; and Diarrhea      HPI:    This 48 year old female patient presents to the emergency department with headache. She reports that the headache started yesterday. Headache is described as pressure on left side of head. Gradual onset. Not worst headache of their life. History of headaches in the past. Associated symptoms include pain in her left arm, tingling to her face. No fevers or chills. No confusion. No weakness, numbness, vision changes, or speech changes described to me. No trauma or head injury reported. She also endorses recent urinary symptoms including dysuria, urinary frequency. She has also developed diarrheal illness for 1 day. She reports multiple sick contact with similar symptoms- related to "bad food we ate".    ROS: Pertinent positives were reviewed as per the HPI above. All other systems were reviewed and are negative.    Past Medical History/Problem list:    Past Medical History    FH: diabetes mellitus 11/01/2012    Comment: Mother is diabetic    Disorder of lipid metabolism      Patient Active Problem List:     FH: diabetes mellitus     Iron deficiency anemia     H. pylori infection     Pure hyperglyceridemia      Past Surgical History:     Past Surgical History    ------------OTHER-------------  1999    Comment plastic surgery - abdominoplasty    ------------OTHER-------------  2005    Comment pelvic procedure in [place]    EXCISION EXCESSIVE SKIN & SUBQ TISSUE ABDOMEN         Medications:   No current facility-administered medications on file prior to encounter.  Current Outpatient Prescriptions on File Prior to Encounter:  Biotin 1000 MCG TABS Tablet Take 1,000 mcg by mouth daily. Disp:  Rfl:        Social History:    Social History   Marital Status: Married  Spouse Name: N/A    Years of Education: N/A  Number of Children: N/A     Occupational History  None on file     Social History Main Topics   Smoking status: Never Smoker     Smokeless tobacco: Never Used    Alcohol Use: No    Drug Use: No    Sexual Activity: Yes    Partners: Male    Pharmacist, hospital Protection: Vasectomy     Other Topics Concern    Blood Transfusions No    Seat Belt Yes     Social History Narrative    Born in [place] - living in [place] x 9 yrs.    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    2 children - ages 68 and 57; has step grandchildren.       Allergies:  Review of Patient's Allergies indicates:   Chlorpromazine          Anaphylaxis   Atorvastatin            Other (See Comments)    Comment:Myalgia    Physical Exam:  Patient Vitals for the past 24 hrs:   BP Temp Pulse Resp SpO2 Weight   01/30/14 1207 114/66 mmHg - 73 - 98 % -   01/30/14 1110 132/89  mmHg 98.5 F 75 18 100 % 79 kg (174 lb 2.6 oz)       GENERAL:  Well appearing, afebrile, no acute distress, non-toxic   SKIN:  No rash, no petechia.  HEAD:  NCAT. Oropharynx is clear with moist mucous membranes. PERRL. EOMI.  NECK:  No C-spine tenderness, crepitus.  No meningismus. Full ROM without difficulty or limitation  LUNGS:  Clear to auscultation bilaterally. No wheezes, rales, rhonchi.   HEART:  RRR.   ABDOMEN:  Soft, NTND. No involuntary guarding or rebound.  No peritoneal signs.  EXTREMITIES:  No obvious deformities.  Warm and well perfused.   NEUROLOGIC:  Alert and oriented x4, moves all extremities with full power and strength, sensation intact bilaterally, cranial nerves intact, finger to nose intact, speaking in clear fluent sentences, visual fields intact. Normal gait without ataxia. No focal findings.  PSYCHIATRIC:  Appropriate for age, time of day, and situation    ED Course and Medical Decision-making:  48 year old female patient presents with headache.  She has a history of headaches and does  relate this in similarity to their prior headaches. No evidence of meningitis or encephalitis.  Other serious sources of headache were considered, including intracranial bleeding, mass, trauma. Normal neurological exam.    CT of brain shows no evidence of intracranial bleed or mass or other acute intracranial pathology.  LP was discussed but declined. Patient has the capacity to make this decision.    The patient was given medications commonly used to treat headaches with complete resolution of their headache and symptoms.      She feels well and does wish to be discharged. Her symptoms could be related to her UTI and/or diarrheal illness, with a component of dehydration driving this headache.    I discussed discharge instructions with the patient.  I gave them explicit return precautions which included increased pain, different pain, pain that doesn't resolve, fevers, nausea or vomiting, neck pain or stiffness, weakness, numbness, tingling, vision or speech changes or any other neurological changes.  They express understanding of these return precautions.    The patient agrees with this plan and disposition.  Patient had an opportunity to ask questions which were answered.       Condition on Discharge: Improved     Diagnosis/Diagnoses:  Headache  (primary encounter diagnosis)  History of migraine  Urinary tract infection    Temple PaciniShane B Jafeth Mustin MD    This Emergency Department patient encounter note was created using voice-recognition software and in real time during the ED visit. Please excuse any typographical errors that have not yet been reviewed and corrected.

## 2014-01-30 NOTE — ED Notes (Signed)
Denies numbness or tingling of any kind at this time. No altered sensation reported.

## 2014-01-31 ENCOUNTER — Telehealth (HOSPITAL_BASED_OUTPATIENT_CLINIC_OR_DEPARTMENT_OTHER): Payer: Self-pay

## 2014-01-31 LAB — URINE CULTURE

## 2014-01-31 NOTE — Progress Notes (Signed)
Ed f/u letter sent, also reminded patient of need to select new pcp.

## 2020-11-14 ENCOUNTER — Encounter (HOSPITAL_BASED_OUTPATIENT_CLINIC_OR_DEPARTMENT_OTHER): Payer: Self-pay

## 2023-12-02 ENCOUNTER — Other Ambulatory Visit: Payer: Self-pay | Admitting: Internal Medicine

## 2023-12-06 IMAGING — MR [HOSPITAL]^JOELHO
5 of 7 series · 25 of 40 positions shown · non-contrast
Comparison: none

[Series 3: localizador axial · axial · left · 6.0mm · 0.78mm/px · z∈[-64,+17]mm · 3 of 10 slices shown]
[im 1/10]
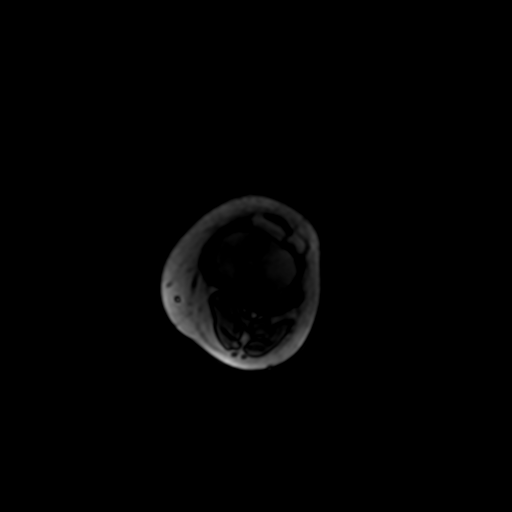
[im 5/10]
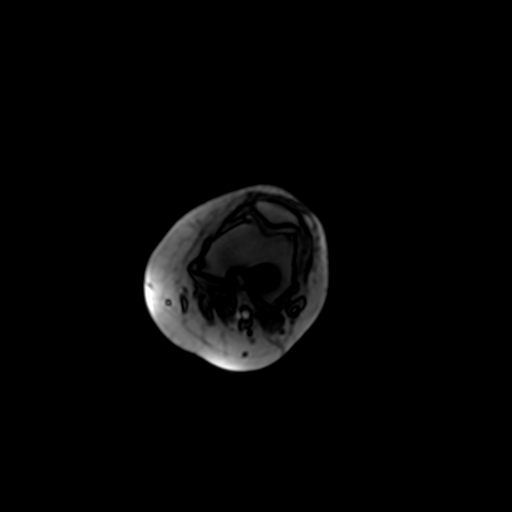
[im 10/10]
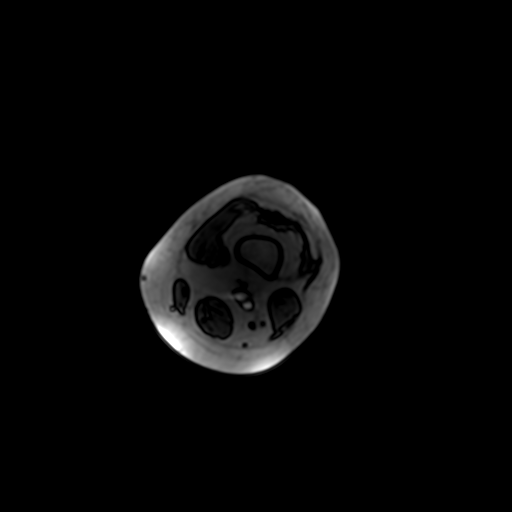

[Series 4: STIR · sagittal · left · 3.5mm · 0.35mm/px · 7 of 26 slices shown]
[im 1/26]
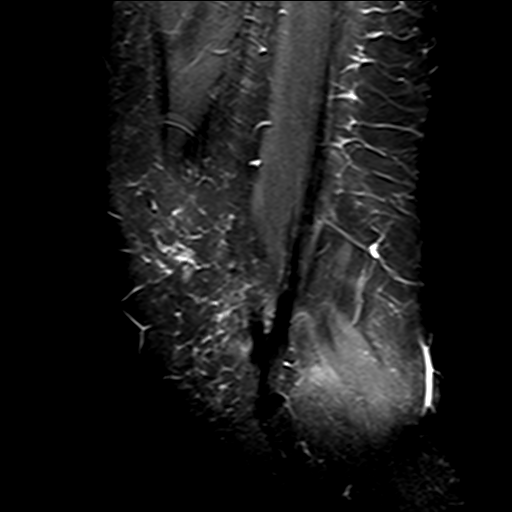
[im 5/26]
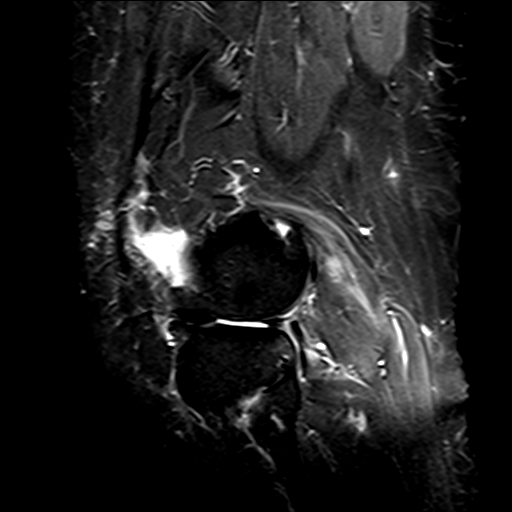
[im 9/26]
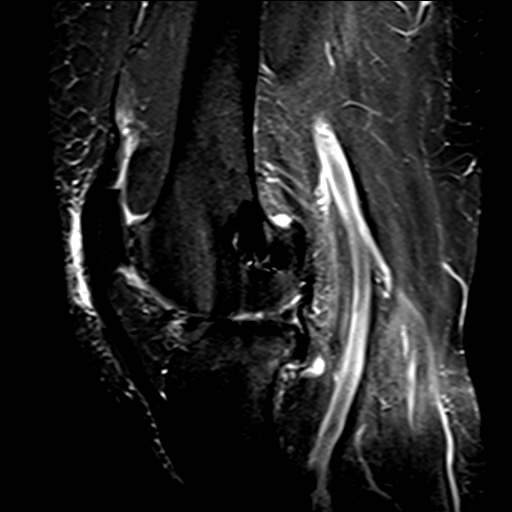
[im 13/26]
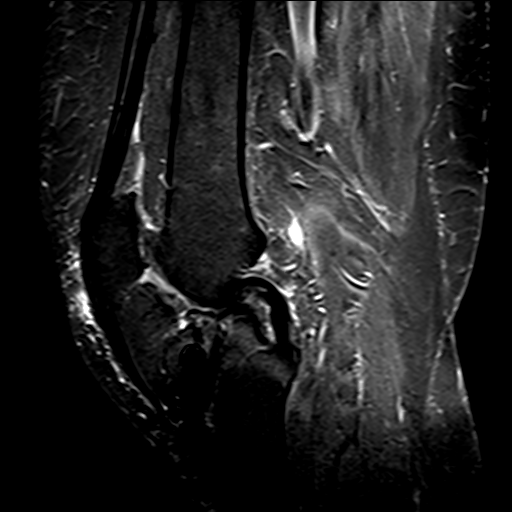
[im 17/26]
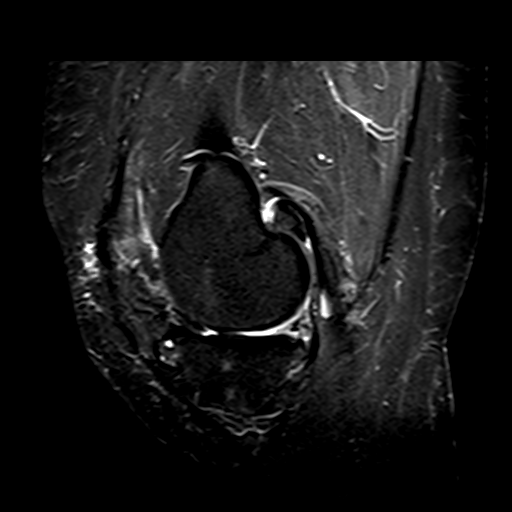
[im 21/26]
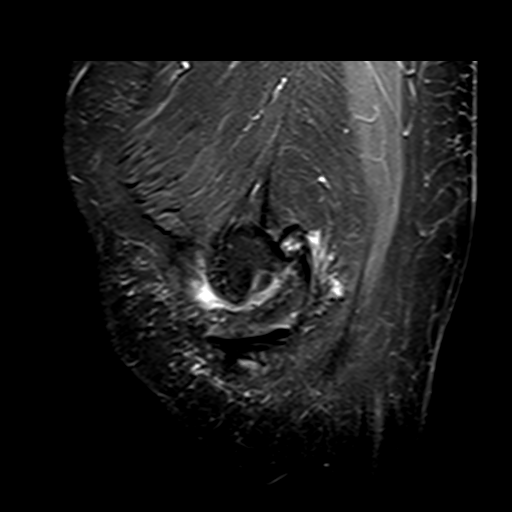
[im 26/26]
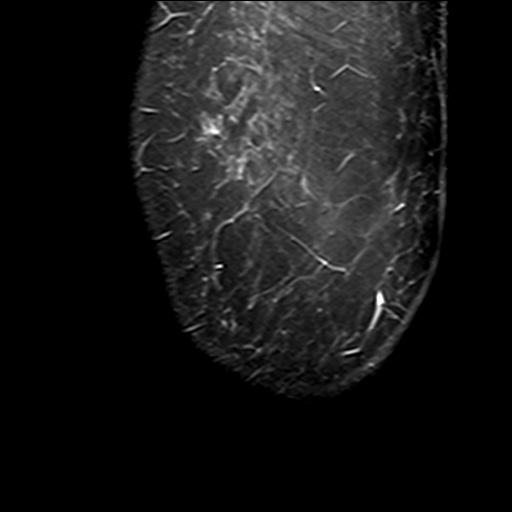

[Series 5: sagital dp · sagittal · left · 3.5mm · 0.40mm/px · 7 of 26 slices shown]
[im 1/26]
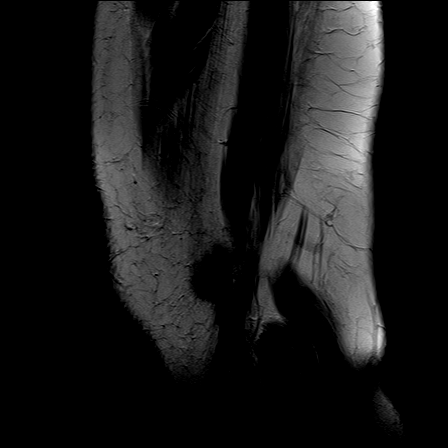
[im 5/26]
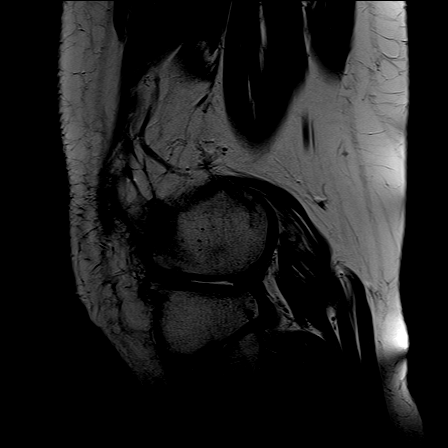
[im 9/26]
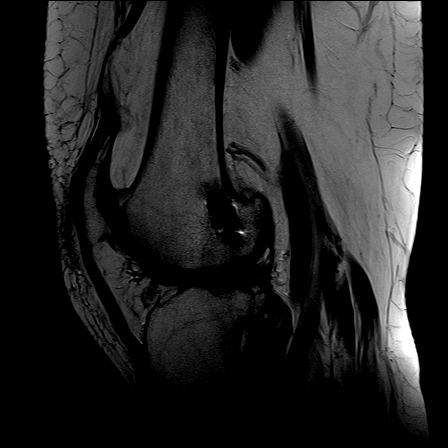
[im 13/26]
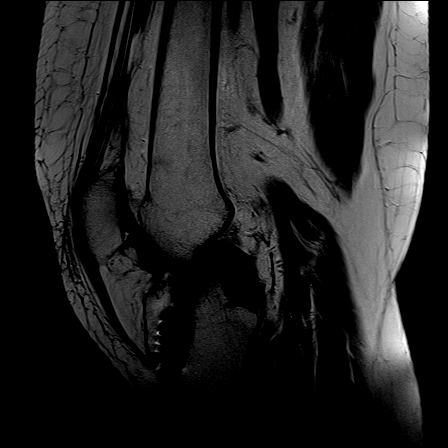
[im 17/26]
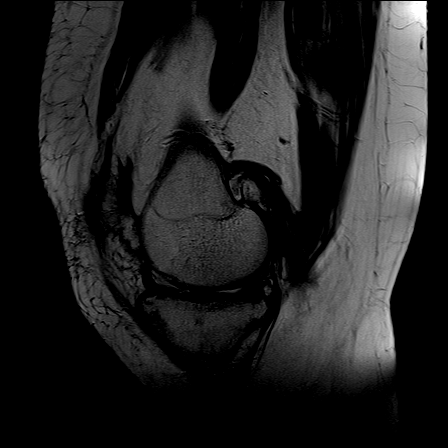
[im 21/26]
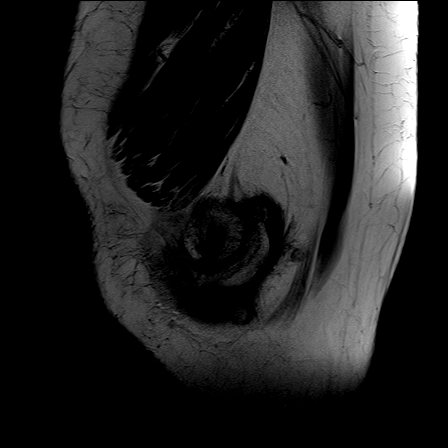
[im 26/26]
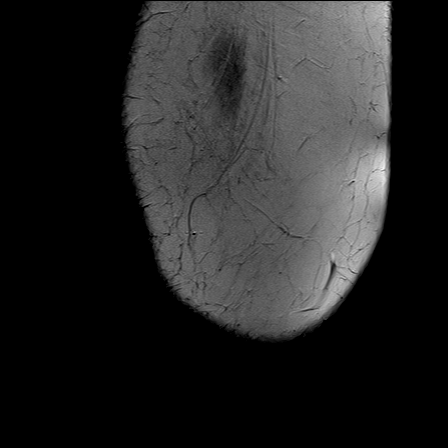

[Series 6: cor dp fat · coronal · left · 3.5mm · 0.70mm/px · 3 of 20 slices shown]
[im 1/20]
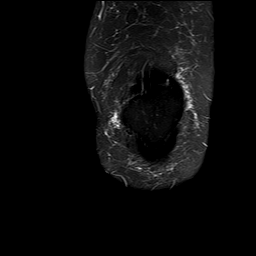
[im 5/20]
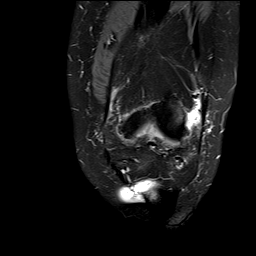
[im 10/20]
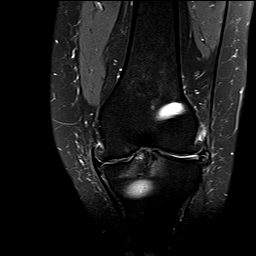

[Series 7: T1 · coronal · left · 3.5mm · 0.56mm/px · 5 of 20 slices shown]
[im 1/20]
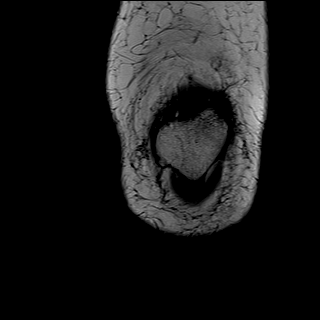
[im 5/20]
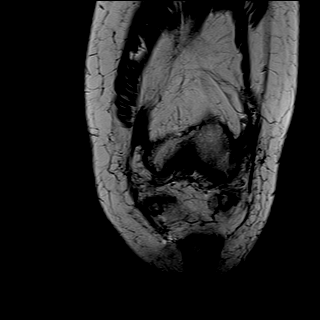
[im 10/20]
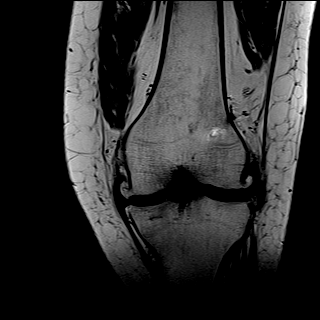
[im 15/20]
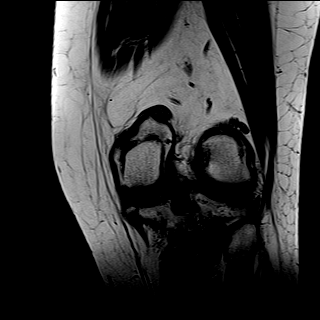
[im 20/20]
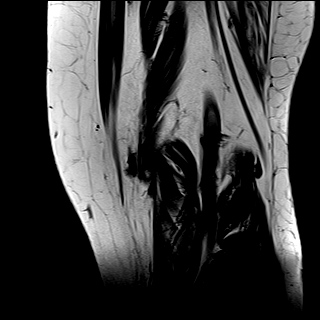

[25 of 40 positions shown; findings below may reference images not displayed]

Método:
Ressonância magnética realizada com sequências FSE em T1 e T2. Planos de cortes múltiplos.
Análise:
Sinais de reconstrução do ligamento cruzado anterior com túneis ósseos femoral e tibial associados a material
de  interferência  adjacente,  produzindo  artefatos  de  susceptibilidade  magnética  e  degradando  as  imagens
RESSONÂNCIA MAGNÉTICA DO JOELHO ESQUERDO
regionais,  prejudicando  parcialmente  a  avaliação  do  estudo.  Não  se  caracteriza  o  enxerto  ligamentar,
sugestivo de rotura prévia.
Ligamento cruzado posterior verticalizado, porém íntegro.
Ligamentos colaterais preservados.
Redução  volumétrica  do  corpo  e  corno  posterior  do  menisco  medial,  com  irregularidade  da  margem  livre  e
com amputação/rotura radial próximo da raiz posterior, com leve perimeniscite notando-se extrusão parcial
do corpo meniscal em relação a interlinha articular.
Alteração degenerativa difusa do menisco lateral com amputação e regularidade da margem livre, bem como
redução volumétrica do corpo e corno posterior, sem fragmentos meniscais destacados.
Alterações  degenerativas  dos  compartimentos  femorotibiais  caracterizadas  por  reações  osteofitárias
marginais,  com  afilamento  e  irregularidade  difusa  dos  revestimentos  condrais,  predominando  nas  áreas  de
carga  e  notadamente  no  medial,  no  qual  se  observam  áreas  de  exposição  da  placa  óssea  subcondral,  com
leve edema e cistos subcondrais adjacentes, bem como remodelamento das superfícies articulares apostas. 
Alterações degenerativas do compartimento patelofemoral caracterizadas por reações osteofitárias marginais,
com  afilamento  e  irregularidade  dos  revestimentos  condrais,  predominando  nos  terços  médio  e  inferior  da
patela  e  nos  terços  superior  e  médio  da  tróclea  femoral,  com  algumas  fissuras  profundas  adjacentes,
notando-se leve edema e cistos subcondrais na tróclea femoral.
Tendão do quadríceps e ligamento patelar íntegros.
Hipertrofia das espinhas tibiais, com edema e cistos subcorticais adjacentes.
Demais estruturas ósseas com morfologia e sinal normais.
Pequeno derrame articular com sinais de sinovite, provavelmente reacional.
Fina lâmina líquida no recesso do gastrocnêmio medial/semimembranoso.
Demais planos musculares e tendíneos preservados.
Feixes neurovasculares com trajetos livres.
Leve edema da tela subcutânea anterior do joelho, sem coleções.
IMPRESSÃO DIAGNÓSTICA:
Sinais de reconstrução do ligamento cruzado anterior. Não se caracteriza o enxerto ligamentar, sugestivo de
rotura prévia.
Alteração degenerativa difusa dos meniscos, notadamente no medial, pormenorizada acima, com extrusão do
corpo meniscal em relação à interlinha articular e sinais de perimeniscite.
Alteração degenerativa dos compartimentos femorotibiais.
Alteração degenerativa incipiente patelofemoral.
Pequeno derrame articular com sinais de sinovite, reacional.
Demais achados descritos no corpo do laudo.
